# Patient Record
Sex: Male | Born: 1945
Health system: Southern US, Community
[De-identification: ages and names within clinical notes are randomized; demographics above are authoritative.]

## PROBLEM LIST (undated history)

## (undated) DIAGNOSIS — E785 Hyperlipidemia, unspecified: Secondary | ICD-10-CM

## (undated) DIAGNOSIS — C61 Malignant neoplasm of prostate: Secondary | ICD-10-CM

## (undated) DIAGNOSIS — I10 Essential (primary) hypertension: Secondary | ICD-10-CM

## (undated) DIAGNOSIS — G4733 Obstructive sleep apnea (adult) (pediatric): Secondary | ICD-10-CM

## (undated) DIAGNOSIS — R011 Cardiac murmur, unspecified: Secondary | ICD-10-CM

## (undated) DIAGNOSIS — E119 Type 2 diabetes mellitus without complications: Secondary | ICD-10-CM

## (undated) DIAGNOSIS — Z9989 Dependence on other enabling machines and devices: Secondary | ICD-10-CM

## (undated) DIAGNOSIS — R9439 Abnormal result of other cardiovascular function study: Secondary | ICD-10-CM

## (undated) DIAGNOSIS — I2119 ST elevation (STEMI) myocardial infarction involving other coronary artery of inferior wall: Secondary | ICD-10-CM

## (undated) DIAGNOSIS — I2510 Atherosclerotic heart disease of native coronary artery without angina pectoris: Secondary | ICD-10-CM

## (undated) HISTORY — DX: Abnormal result of other cardiovascular function study: R94.39

## (undated) HISTORY — DX: Malignant neoplasm of prostate: C61

## (undated) HISTORY — DX: Hyperlipidemia, unspecified: E78.5

## (undated) HISTORY — DX: Obstructive sleep apnea (adult) (pediatric): G47.33

## (undated) HISTORY — DX: Cardiac murmur, unspecified: R01.1

## (undated) HISTORY — DX: Atherosclerotic heart disease of native coronary artery without angina pectoris: I25.10

## (undated) HISTORY — DX: Dependence on other enabling machines and devices: Z99.89

## (undated) HISTORY — DX: ST elevation (STEMI) myocardial infarction involving other coronary artery of inferior wall: I21.19

## (undated) HISTORY — DX: Type 2 diabetes mellitus without complications: E11.9

## (undated) HISTORY — DX: Essential (primary) hypertension: I10

---

## 1983-08-27 DIAGNOSIS — I2119 ST elevation (STEMI) myocardial infarction involving other coronary artery of inferior wall: Secondary | ICD-10-CM

## 1983-08-27 HISTORY — DX: ST elevation (STEMI) myocardial infarction involving other coronary artery of inferior wall: I21.19

## 1988-08-26 HISTORY — PX: PTCA: SHX146

## 1989-08-26 HISTORY — PX: PERCUTANEOUS CORONARY ROTOBLATOR INTERVENTION (PCI-R): SHX6015

## 1993-08-26 HISTORY — PX: CORONARY ANGIOPLASTY WITH STENT PLACEMENT: SHX49

## 1999-02-20 ENCOUNTER — Ambulatory Visit (HOSPITAL_BASED_OUTPATIENT_CLINIC_OR_DEPARTMENT_OTHER): Admission: RE | Admit: 1999-02-20 | Discharge: 1999-02-20 | Payer: Self-pay | Admitting: Orthopedic Surgery

## 1999-02-22 ENCOUNTER — Encounter: Payer: Self-pay | Admitting: Orthopedic Surgery

## 1999-02-22 ENCOUNTER — Ambulatory Visit (HOSPITAL_COMMUNITY): Admission: RE | Admit: 1999-02-22 | Discharge: 1999-02-23 | Payer: Self-pay | Admitting: Orthopedic Surgery

## 2001-12-24 ENCOUNTER — Inpatient Hospital Stay (HOSPITAL_COMMUNITY): Admission: EM | Admit: 2001-12-24 | Discharge: 2001-12-26 | Payer: Self-pay | Admitting: Cardiovascular Disease

## 2001-12-25 HISTORY — PX: CORONARY ANGIOPLASTY WITH STENT PLACEMENT: SHX49

## 2003-02-11 HISTORY — PX: CARDIAC CATHETERIZATION: SHX172

## 2003-11-07 ENCOUNTER — Inpatient Hospital Stay (HOSPITAL_COMMUNITY): Admission: EM | Admit: 2003-11-07 | Discharge: 2003-11-10 | Payer: Self-pay

## 2003-11-07 HISTORY — PX: CORONARY ANGIOPLASTY WITH STENT PLACEMENT: SHX49

## 2003-12-05 ENCOUNTER — Ambulatory Visit (HOSPITAL_COMMUNITY): Admission: RE | Admit: 2003-12-05 | Discharge: 2003-12-06 | Payer: Self-pay | Admitting: Cardiovascular Disease

## 2003-12-05 HISTORY — PX: CORONARY ANGIOPLASTY WITH STENT PLACEMENT: SHX49

## 2004-10-28 ENCOUNTER — Inpatient Hospital Stay (HOSPITAL_COMMUNITY): Admission: EM | Admit: 2004-10-28 | Discharge: 2004-10-30 | Payer: Self-pay | Admitting: Emergency Medicine

## 2004-10-29 HISTORY — PX: CARDIAC CATHETERIZATION: SHX172

## 2005-10-07 IMAGING — CR DG CHEST 1V PORT
1 series · 1 of 1 positions shown · non-contrast
Comparison: Chest x-ray of 11/08/03.

CLINICAL DATA: Precardiac catheterization chest x-ray. 
 PORTABLE CHEST - [DATE] AT 7707 HOURS:

[view not recorded]
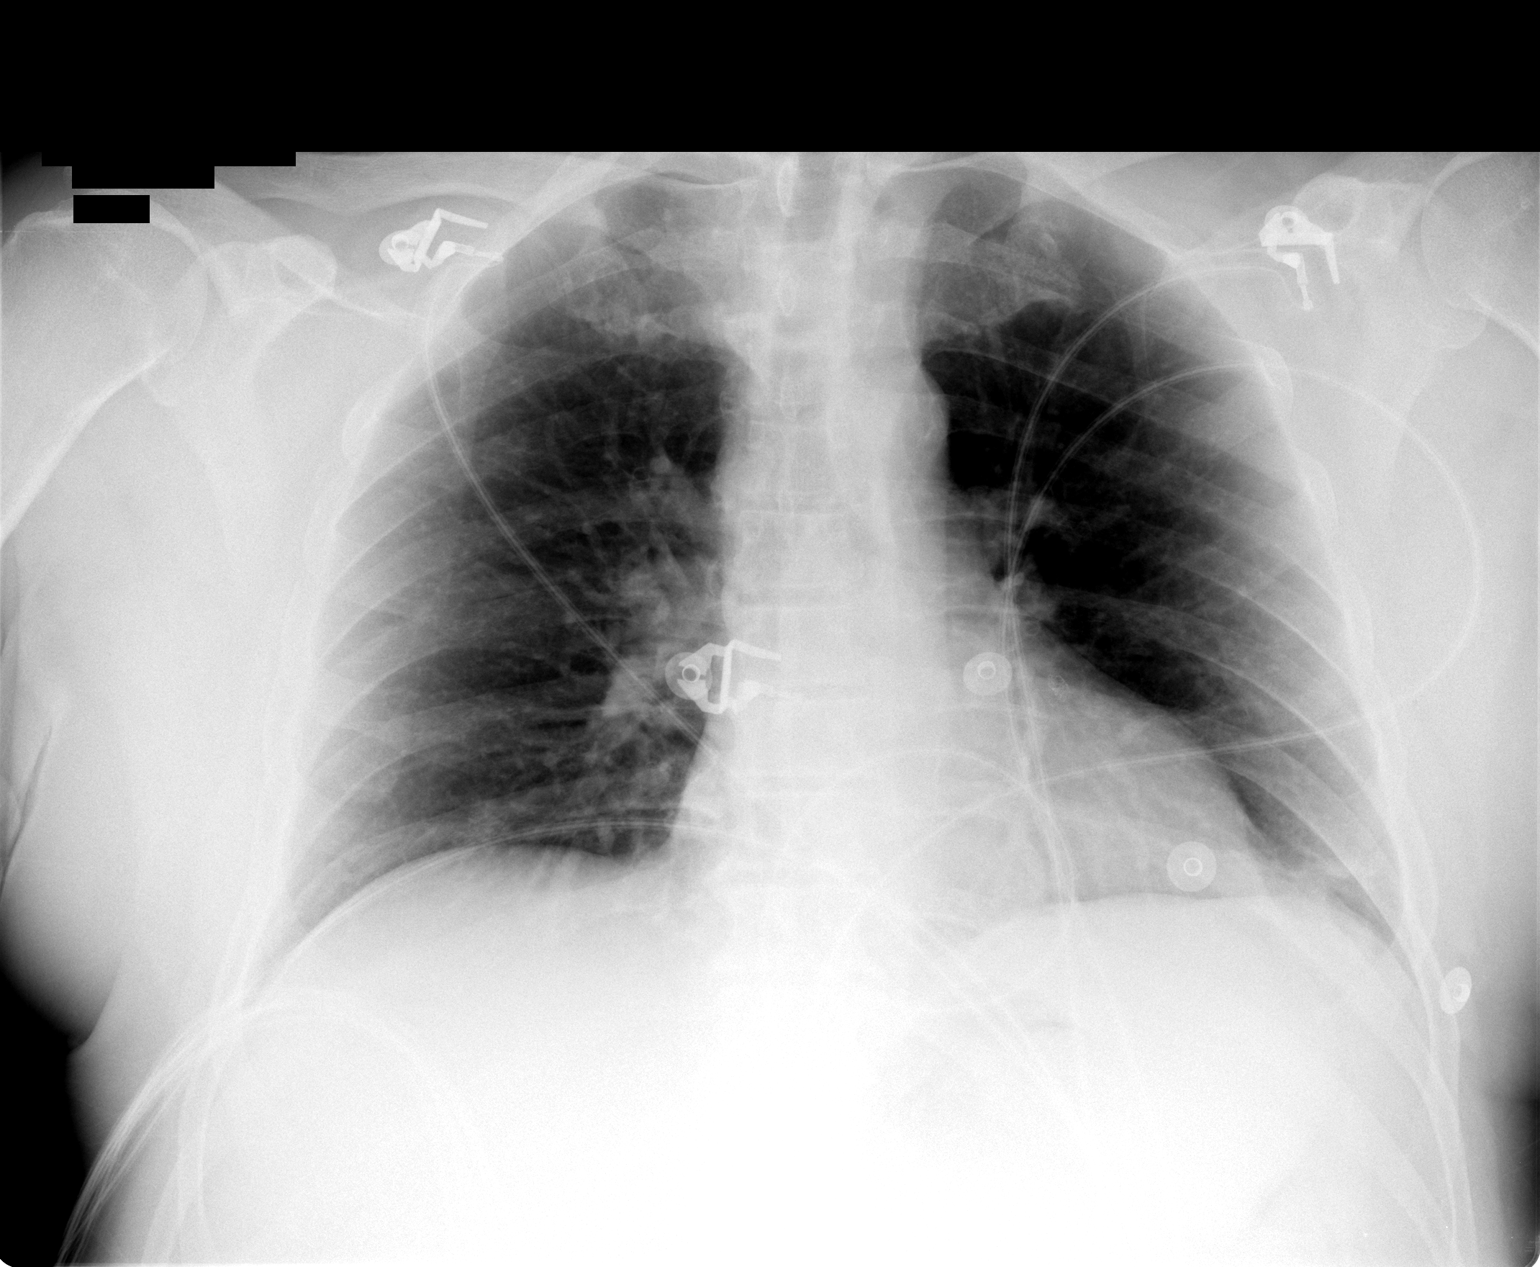

[1 of 1 positions shown; findings below may reference images not displayed]

Mild left basilar linear atelectasis is present.  Opacity medially at the left lung base may represent hiatal hernia.  The heart is within normal limits in size.
IMPRESSION: Mild left basilar linear atelectasis.  Question small hiatal hernia.

## 2012-03-03 DIAGNOSIS — R9439 Abnormal result of other cardiovascular function study: Secondary | ICD-10-CM

## 2012-03-03 HISTORY — DX: Abnormal result of other cardiovascular function study: R94.39

## 2013-01-05 ENCOUNTER — Telehealth: Payer: Self-pay | Admitting: *Deleted

## 2013-01-05 NOTE — Telephone Encounter (Signed)
Returned call from pt on 5.12.14 (also returned call at 5:38pm on 5.12.14).  No answer/No voicemail.  Will await return call from pt.    Pt with concerns r/t sleep apnea and has been scheduled to see Dr. Tresa Endo on 5.29.14 at 8:30am.

## 2013-01-08 ENCOUNTER — Telehealth: Payer: Self-pay | Admitting: Cardiovascular Disease

## 2013-01-08 NOTE — Telephone Encounter (Signed)
Received a letter to call about lab results!

## 2013-01-08 NOTE — Telephone Encounter (Signed)
Message forwarded to W. Waddell, CMA.  

## 2013-01-08 NOTE — Telephone Encounter (Signed)
Patient received a letter from Brandon Herrera letting him know that he has been scheduled to see Dr. Tresa Endo on May 29th @ 8:30 for his sleep apnea evaluation.

## 2013-01-17 ENCOUNTER — Encounter: Payer: Self-pay | Admitting: *Deleted

## 2013-01-20 ENCOUNTER — Encounter: Payer: Self-pay | Admitting: Cardiovascular Disease

## 2013-01-21 ENCOUNTER — Ambulatory Visit (INDEPENDENT_AMBULATORY_CARE_PROVIDER_SITE_OTHER): Payer: Medicare Other | Admitting: Cardiovascular Disease

## 2013-01-21 ENCOUNTER — Encounter: Payer: Self-pay | Admitting: Cardiovascular Disease

## 2013-01-21 VITALS — BP 120/60 | HR 84 | Ht 69.0 in | Wt 205.0 lb

## 2013-01-21 DIAGNOSIS — G4730 Sleep apnea, unspecified: Secondary | ICD-10-CM | POA: Insufficient documentation

## 2013-01-21 DIAGNOSIS — E119 Type 2 diabetes mellitus without complications: Secondary | ICD-10-CM

## 2013-01-21 DIAGNOSIS — I119 Hypertensive heart disease without heart failure: Secondary | ICD-10-CM

## 2013-01-21 DIAGNOSIS — E785 Hyperlipidemia, unspecified: Secondary | ICD-10-CM

## 2013-01-21 DIAGNOSIS — I2510 Atherosclerotic heart disease of native coronary artery without angina pectoris: Secondary | ICD-10-CM

## 2013-01-21 NOTE — Patient Instructions (Signed)
Your physician recommends that you schedule a follow-up appointment in: 1 YEAR.  No changes have been made in your cardiac care today.

## 2013-01-21 NOTE — Progress Notes (Signed)
Patient ID: Brandon Herrera, male   DOB: 25-Apr-1946, 67 y.o.   MRN: [id number] HPI: Brandon Herrera, is a 67 y.o. male who presents to the office today for sleep clinic evaluation and cardiology followup evaluation. Brandon Herrera is a 67 year old male who has known coronary artery disease. He suffered in an inferior myocardial infarction in 1985 and was found to have total RCA occlusion. In 1990 he underwent PTCA of the circumflex and in 1991 directional coronary atherectomy of an eccentric ossified LAD stenosis. He is also status post interventions to circumflex coronary artery with his last intervention in 2005 at which time he also had a intervention to the LAD and first diagonal vessel. He has a history of type 2 diabetes mellitus, hypertension, and hyperlipidemia. He now sees Dr. Jefm Miles in [place]. He also has remote history of prostate CA and is status post prostate seed implantation  In 2011 he ultimately consented to undergo a sleep study to my concerns for significant sleep apnea on that study, he had mild sleep apnea overall with an AHI of 7.44/hr but sleep apnea was moderate at 16.4/hr.He did drop his oxygen saturation to 88%. He has been using CPAP therapy since 2011 and notes huge difference is in his sense of well-being. He presents now for a one-year followup evaluation.  A download was obtained from last March 2013 through June 2013 which did show 100% usage and averaging 7 hours and 29 minutes per night. At that time, his AHI was 3.5 per hour. I did interrogate his S9 Elite ResMed unit today over the past month he has been averaging 8.2 hours of usage and is unit is set on a 10 cm fixed pressure. He has minimal leak at 9.6. His AHI remains excellent at 3.1. His total apnea index was 0.8.  Epworth Sleepiness Scale: Situation   Chance of Dozing/Sleeping (0 = never , 1 = slight chance , 2 = moderate chance , 3 = high chance )   sitting and reading 0   watching TV 0   sitting inactive in a  public place 0   being a passenger in a motor vehicle for an hour or more 0   lying down in the afternoon 2   sitting and talking to someone 0   sitting quietly after lunch (no alcohol) 0   0  Total score 2    Past Medical History  Diagnosis Date  . CAD (coronary artery disease)   . Inferior MI 1985    totalled RCA  . Hypertension   . Diabetes mellitus   . Hyperlipidemia   . OSA on CPAP   . Prostate cancer     Seed implant  . Heart murmur     07/28/2008 ECHO: mild mitral annular ca+,AOV mildly sclerotic,mild LVH,mod.global hypokinesis,mild to mod post wall hypokinesis, EF 35-40%,LA mildly dilated  . Abnormal nuclear stress test 03/03/2012    mod size inferior scar w/new anterolateral wall ischemia towards apex    Past Surgical History  Procedure Laterality Date  . Ptca  1990    CX  . Percutaneous coronary rotoblator intervention (pci-r)  1991    LAD  . Coronary angioplasty with stent placement  1995    CX & LAD  . Coronary angioplasty with stent placement  12/25/2001    LCX  . Cardiac catheterization  02/11/2003    patent CX stent,chronically occluded RCA  . Coronary angioplasty with stent placement  11/07/2003    CX, planned stenting of  LAD later  . Coronary angioplasty with stent placement  12/05/2003    LAD  . Cardiac catheterization  10/29/2004    No evidence of restenosis LAD but 30-40% narrowing prox. to stent,widely patient CX, old subtotalled RCA    Allergies  Allergen Reactions  . Zetia (Ezetimibe) Other (See Comments)    myalgias    Current Outpatient Prescriptions  Medication Sig Dispense Refill  . amLODipine (NORVASC) 5 MG tablet Take 5 mg by mouth daily.      Marland Kitchen aspirin 81 MG tablet Take 81 mg by mouth daily.      Marland Kitchen atorvastatin (LIPITOR) 40 MG tablet Take 40 mg by mouth daily.      . benazepril (LOTENSIN) 20 MG tablet Take 20 mg by mouth daily.      . carvedilol (COREG) 25 MG tablet Take 25 mg by mouth 2 (two) times daily with a meal.      . clopidogrel  (PLAVIX) 75 MG tablet Take 75 mg by mouth daily.      . diphenhydrAMINE (BENADRYL) 25 MG tablet Take 25 mg by mouth every 6 (six) hours as needed for itching.      . fish oil-omega-3 fatty acids 1000 MG capsule Take 1 g by mouth daily. 1-2 daily      . insulin glargine (LANTUS) 100 UNIT/ML injection Inject 35 Units into the skin 2 (two) times daily.      . isosorbide mononitrate (IMDUR) 30 MG 24 hr tablet Take 30 mg by mouth daily.      . metFORMIN (GLUCOPHAGE) 500 MG tablet Take 500 mg by mouth 2 (two) times daily with a meal.      . Multiple Vitamin (MULTIVITAMIN) tablet Take 1 tablet by mouth daily.      Marland Kitchen triamterene-hydrochlorothiazide (MAXZIDE-25) 37.5-25 MG per tablet Take 0.5 tablets by mouth daily.      . vitamin B-12 (CYANOCOBALAMIN) 1000 MCG tablet Take 1,000 mcg by mouth daily.      . vitamin E 400 UNIT capsule Take 400 Units by mouth daily. Takes occasionally       No current facility-administered medications for this visit.    SOCHX is notable that he is divorced. He does not have children he did have family issues in the past particularly with his brother. This seems to be improving. There is no tobacco or alcohol use.  ROS is negative for fever chills night sweats. He feels that he is sleeping well with his current unit. He believes he has good sleep efficiency. He does not awaken frequently. Sleep is restorative. He is unaware of breakthrough snoring. He denies restless legs. He denies bruxism. He is unaware of tachycardia palpitations, presyncope or syncope. He denies any recent chest pain. He denies any significant recent swelling. Other system review is negative.  PE BP 120/60  Pulse 84  Ht 5\' 9"  (1.753 m)  Wt 205 lb (92.987 kg)  BMI 30.26 kg/m2  General: Alert, oriented, no distress.  HEENT: Normocephalic, atraumatic. Pupils round and reactive; sclera anicteric; Fundi mild arteriolar narrowing. Nose without nasal septal hypertrophy Mouth/Parynx benign; Mallinpatti  scale 3 Neck: No JVD, no carotid briuts Lungs: clear to ausculatation and percussion; no wheezing or rales Heart: RRR, s1 s2 normal 1/6 systolic murmur Abdomen: soft, nontender; no hepatosplenomehaly, BS+; abdominal aorta nontender and not dilated by palpation. Pulses 2+ Extremities: no clubbinbg cyanosis or edema, Homan's sign negative  Neurologic: grossly nonfocal    LABS:  BMET No results found for this basename: na,  k, cl, co2, glucose, bun, creatinine, calcium, gfrnonaa, gfraa     Hepatic Function Panel  No results found for this basename: prot, albumin, ast, alt, alkphos, bilitot, bilidir, ibili     CBC No results found for this basename: wbc, rbc, hgb, hct, plt, mcv, mch, mchc, rdw, neutrabs, lymphsabs, monoabs, eosabs, basosabs     BNP No results found for this basename: probnp    Lipid Panel  No results found for this basename: chol, trig, hdl, cholhdl, vldl, ldlcalc     RADIOLOGY: No results found.    ASSESSMENT AND PLAN: From a sleep perspective, Mr. Wilmes is doing well. His CPAP unit is an S9 Elite, and he uses a full face mask Quatro medium size. I did look at his machine today in the office. He is in need for new filter and essentially has only changed his filter once in the last 3 years. We also provided him to get new cushions for his face mask. His sleep pattern is stable. His AHI is excellent his Epworth sleepiness score argues against residual daytime sleepiness. From a cardiac perspective he is chest pain-free without arrhythmias. BP remain stable. He tells me his primary care physician recently checked laboratory in [place] and I will try to obtain the results of this blood work. Target LDL is less than 70 in this patient with established coronary artery disease. I will see him in one year for followup evaluation.    Lennette Bihari, MD, [facility]  01/21/2013 10:27 AM

## 2013-02-08 ENCOUNTER — Other Ambulatory Visit: Payer: Self-pay | Admitting: *Deleted

## 2013-02-08 MED ORDER — BENAZEPRIL HCL 20 MG PO TABS: 20.0000 mg | ORAL_TABLET | Freq: Every day | ORAL | Status: DC

## 2013-09-02 DIAGNOSIS — C61 Malignant neoplasm of prostate: Secondary | ICD-10-CM | POA: Insufficient documentation

## 2013-09-20 ENCOUNTER — Ambulatory Visit (INDEPENDENT_AMBULATORY_CARE_PROVIDER_SITE_OTHER): Payer: Medicare HMO | Admitting: Cardiovascular Disease

## 2013-09-20 ENCOUNTER — Encounter: Payer: Self-pay | Admitting: Cardiovascular Disease

## 2013-09-20 VITALS — BP 114/76 | HR 75 | Ht 70.0 in | Wt 202.7 lb

## 2013-09-20 DIAGNOSIS — G4730 Sleep apnea, unspecified: Secondary | ICD-10-CM

## 2013-09-20 DIAGNOSIS — E119 Type 2 diabetes mellitus without complications: Secondary | ICD-10-CM

## 2013-09-20 DIAGNOSIS — E785 Hyperlipidemia, unspecified: Secondary | ICD-10-CM

## 2013-09-20 DIAGNOSIS — I119 Hypertensive heart disease without heart failure: Secondary | ICD-10-CM

## 2013-09-20 DIAGNOSIS — I2510 Atherosclerotic heart disease of native coronary artery without angina pectoris: Secondary | ICD-10-CM

## 2013-09-20 NOTE — Patient Instructions (Signed)
Your physician wants you to follow-up in: 1 year. You will receive a reminder letter in the mail two months in advance. If you don't receive a letter, please call our office to schedule the follow-up appointment.  

## 2013-09-21 ENCOUNTER — Encounter: Payer: Self-pay | Admitting: Cardiovascular Disease

## 2013-09-21 NOTE — Progress Notes (Signed)
Patient ID: [name], male   DOB: July 27, 1946, 69 y.o.   MRN: [id number]    HPI: Brandon Herrera, is a 68 y.o. male who presents to the office today for cardiology followup evaluation.  Brandon Herrera is a 68 year old male who has known coronary artery disease. He suffered in an inferior myocardial infarction in 1985 and was found to have total RCA occlusion. In 1990 he underwent PTCA of the circumflex and in 1991 directional coronary atherectomy of an eccentric ossified LAD stenosis. He is also status post interventions to circumflex coronary artery with his last intervention in 2005 at which time he also had a intervention to the LAD and first diagonal vessel. He has a history of type 2 diabetes mellitus, hypertension, and hyperlipidemia.  He also has remote history of prostate CA and is status post prostate seed implantation  In 2011 he ultimately consented to undergo a sleep study to my concerns for significant sleep apnea on that study, he had mild sleep apnea overall with an AHI of 7.44/hr but sleep apnea was moderate at 16.4/hr.He did drop his oxygen saturation to 88%. He has been using CPAP therapy since 2011 and notes huge difference is in his sense of well-being.  A download from March 2013 through June 2013 showed 100% usage, averaging 7 hours and 29 minutes per night. At that time, his AHI was 3.5 per hour. He presently denies any breakthrough snoring. He denies residual daytime sleepiness. He denies restless legs.  Over the past 6 months, he has remained fairly stable from a cardiac standpoint. Specifically he denies recurrent anginal symptoms. He believes his blood pressure has been controlled.  Recently, he saw Dr. Arlyss Repress for URI symptoms and was treated for bronchitis. He also has experienced continued cough.    Past Medical History  Diagnosis Date  . CAD (coronary artery disease)   . Inferior MI 1985    totalled RCA  . Hypertension   . Diabetes mellitus   . Hyperlipidemia   .  OSA on CPAP   . Prostate cancer     Seed implant  . Heart murmur     07/28/2008 ECHO: mild mitral annular ca+,AOV mildly sclerotic,mild LVH,mod.global hypokinesis,mild to mod post wall hypokinesis, EF 35-40%,LA mildly dilated  . Abnormal nuclear stress test 03/03/2012    mod size inferior scar w/new anterolateral wall ischemia towards apex    Past Surgical History  Procedure Laterality Date  . Ptca  1990    CX  . Percutaneous coronary rotoblator intervention (pci-r)  1991    LAD  . Coronary angioplasty with stent placement  1995    CX & LAD  . Coronary angioplasty with stent placement  12/25/2001    LCX  . Cardiac catheterization  02/11/2003    patent CX stent,chronically occluded RCA  . Coronary angioplasty with stent placement  11/07/2003    CX, planned stenting of LAD later  . Coronary angioplasty with stent placement  12/05/2003    LAD  . Cardiac catheterization  10/29/2004    No evidence of restenosis LAD but 30-40% narrowing prox. to stent,widely patient CX, old subtotalled RCA    Allergies  Allergen Reactions  . Zetia [Ezetimibe] Other (See Comments)    myalgias    Current Outpatient Prescriptions  Medication Sig Dispense Refill  . amLODipine (NORVASC) 5 MG tablet Take 5 mg by mouth daily.      Marland Kitchen aspirin 81 MG tablet Take 81 mg by mouth daily.      Marland Kitchen  atorvastatin (LIPITOR) 40 MG tablet Take 40 mg by mouth daily.      . benazepril (LOTENSIN) 20 MG tablet Take 1 tablet (20 mg total) by mouth daily.  30 tablet  11  . carvedilol (COREG) 25 MG tablet Take 25 mg by mouth 2 (two) times daily with a meal.      . cephALEXin (KEFLEX) 500 MG capsule Take 1 capsule by mouth 3 (three) times daily.      . clopidogrel (PLAVIX) 75 MG tablet Take 75 mg by mouth daily.      . fish oil-omega-3 fatty acids 1000 MG capsule Take 1 g by mouth daily. 1-2 daily      . insulin glargine (LANTUS) 100 UNIT/ML injection Inject 35 Units into the skin 2 (two) times daily.      . isosorbide mononitrate  (IMDUR) 30 MG 24 hr tablet Take 30 mg by mouth daily.      . metFORMIN (GLUCOPHAGE) 500 MG tablet Take 500 mg by mouth 2 (two) times daily with a meal.      . Multiple Vitamin (MULTIVITAMIN) tablet Take 1 tablet by mouth daily.      . predniSONE (DELTASONE) 10 MG tablet Take 1 tablet by mouth as directed.      . triamterene-hydrochlorothiazide (MAXZIDE-25) 37.5-25 MG per tablet Take 0.5 tablets by mouth daily.      . vitamin B-12 (CYANOCOBALAMIN) 1000 MCG tablet Take 1,000 mcg by mouth daily.      . vitamin E 400 UNIT capsule Take 400 Units by mouth daily. Takes occasionally       No current facility-administered medications for this visit.    SOCHX is notable that he is divorced. He does not have children he did have family issues in the past particularly with his brother. This seems to be improving. There is no tobacco or alcohol use.  ROS is negative for fever chills night sweats. He denies visual changes or hearing changes. He is unaware of lymphadenopathy . He has had a cough and URI symptoms. He denies PND orthopnea. He is unaware of wheezing. He denies anginal symptoms. He denies presyncope or syncope. He denies abdominal pain the there is no blood in stool or urine. He denies change in bowel or bladder habits. There is no claudication. He denies significant edema. He is diabetic. There is no issues with thyroid abnormalities. He feels that he is sleeping well with his current unit. He believes he has good sleep efficiency. He does not awaken frequently. Sleep is restorative. He is unaware of breakthrough snoring. He denies restless legs. He denies bruxism. Other comprehensive 14 point system review is negative.  PE BP 114/76  Pulse 75  Ht 5\' 10"  (1.778 m)  Wt 202 lb 11.2 oz (91.944 kg)  BMI 29.08 kg/m2  General: Alert, oriented, no distress.  HEENT: Normocephalic, atraumatic. Pupils round and reactive; sclera anicteric; Fundi mild arteriolar narrowing. No xanthelasmas Nose without  nasal septal hypertrophy Mouth/Parynx benign; Mallinpatti scale 3 Neck: No JVD, no carotid bruits with normal carotid upstroke Chest wall: Nontender to palpation Lungs: clear to ausculatation and percussion; no wheezing or rales Heart: RRR, s1 s2 normal 1/6 systolic murmur, no S3 or S4 gallop Abdomen: soft, nontender; no hepatosplenomehaly, BS+; abdominal aorta nontender and not dilated by palpation. Back: No CVA tenderness Pulses 2+ Extremities: no clubbinbg cyanosis or edema, Homan's sign negative  Neurologic: grossly nonfocal, cranial nerves grossly normal. Psychological: Normal affect and mood   ECG (independently interpreted by me): Normal  sinus rhythm. Nonspecific T changes. Normal intervals.    LABS:  BMET No results found for this basename: na,  k,  cl,  co2,  glucose,  bun,  creatinine,  calcium,  gfrnonaa,  gfraa     Hepatic Function Panel  No results found for this basename: prot,  albumin,  ast,  alt,  alkphos,  bilitot,  bilidir,  ibili     CBC No results found for this basename: wbc,  rbc,  hgb,  hct,  plt,  mcv,  mch,  mchc,  rdw,  neutrabs,  lymphsabs,  monoabs,  eosabs,  basosabs     BNP No results found for this basename: probnp    Lipid Panel  No results found for this basename: chol,  trig,  hdl,  cholhdl,  vldl,  ldlcalc     RADIOLOGY: No results found.    ASSESSMENT AND PLAN: Brandon Herrera has established coronary artery disease documented over 30 years when he presented with his initially or wall myocardial infarction in 1985 was found to have total RCA occlusion. He also is status post remote PTCA of the circumflex in 1990, and in 1991 underwent directional coronary directly of the eccentrically calcified LAD stenosis. His last intervention in 2005 was done to his LAD and first diagonal vessel which was stented. He has continued to be stable without recurrent anginal symptomatology. His blood pressure is currently well-controlled on his medical  regimen consisting of amlodipine 5 mg, Lotensin 20 mg, carvedilol 25 mg twice a day. He also is on isosorbide mononitrate therapy as well as Maxide. He is on lipid-lowering therapy with atorvastatin 40 mg. He tells me his primary primary physician had recently checked labs. I did obtain a copy of these lab toward results which were done in [place]. His hemoglobin A1c was mildly elevated at 7.3. Renal function liver function studies were normal. The studies were excellent with a total cholesterol of 139 triglycerides 153 HDL 48 LDL 60 on his current therapy. He will continue current treatment as prescribed. He is continuing to use his CPAP with 100% compliance and his Epworth Sleepiness Scale score is normal arguing against hypersomnolence. I will see him in one year for followup evaluation.    Troy Sine, MD, [facility]  09/21/2013 2:12 PM

## 2013-10-25 DIAGNOSIS — F329 Major depressive disorder, single episode, unspecified: Secondary | ICD-10-CM | POA: Insufficient documentation

## 2013-11-08 DIAGNOSIS — G4700 Insomnia, unspecified: Secondary | ICD-10-CM | POA: Insufficient documentation

## 2013-11-11 ENCOUNTER — Telehealth: Payer: Self-pay | Admitting: *Deleted

## 2013-11-11 NOTE — Telephone Encounter (Signed)
Returned call.  Pt informed no update as of yet.  Will call once response given.  Informed Dr. Claiborne Billings is still in clinic seeing patients and RN may not be able to call him back w/ response until tomorrow morning.  Pt verbalized understanding and agreed w/ plan.

## 2013-11-11 NOTE — Telephone Encounter (Signed)
Returned call.  Line busy x 2.  Will try again later.

## 2013-11-11 NOTE — Telephone Encounter (Signed)
Spoke w/ Dr. Claiborne Billings and advised pt call [facility] to do a download.    Call to pt and informed.  Pt verbalized understanding and agreed w/ plan.  Pt given number to [place] [phone] and agreed to contact them to set up download.

## 2013-11-11 NOTE — Telephone Encounter (Signed)
Returned call and pt verified x 2.  Pt stated she takes about 8 different medications.  Stated he take 4 in the morning and wants to know if one of them is making him feel "zapped."   Pt complaints:  Fatigue  Not staying asleep, even w/ Ambien  Taking small naps after waking up ~ 2 - 3 am every morning  No low BPs: stated normal after taking BP meds  ? CPAP, but thinks it is working fine. Changed the cushion the other day  Pharmacist suggested he may need Ambien extended-release (pt concerned r/t how tired he is now)  Pt informed Dr. Claiborne Billings will be notified for further instructions and he will be notified once a response is given.  Also advised to contact [facility] to check CPAP machine to make sure it's working properly as his symptoms sound like they are r/t insomnia.  Pt verbalized understanding and agreed w/ plan.  Message forwarded to Dr. Arnette Norris, CMA.  This note printed and placed on cart.

## 2013-11-11 NOTE — Telephone Encounter (Signed)
Pt called to see if you had found any information for him about his medicine.

## 2013-11-11 NOTE — Telephone Encounter (Signed)
Pt stated that he is very sleepy during the day. He wants to know if it is a side effect of one of his medications. He takes multiple medications and he has some questions.  TK

## 2013-11-18 ENCOUNTER — Telehealth: Payer: Self-pay | Admitting: Cardiovascular Disease

## 2013-11-18 NOTE — Telephone Encounter (Signed)
Returned call and pt verified x 2.  Pt stated he called [facility] and they took the card and sent the information to Dr. Claiborne Billings.  Pt wants to know if his machine is off or not.  Pt informed message will be sent to Dr. Arnette Norris, CMA r/t his machine as this RN is unable to see if fax was received.  Pt verbalized understanding and agreed w/ plan.  Pt doesn't have an answering machine and will call back if call missed.  Message forwarded to Dr. Arnette Norris, CMA.

## 2013-11-18 NOTE — Telephone Encounter (Signed)
Pt called and wanted to know if anybody had called him.This was in regards to his sleep machine.

## 2013-11-22 NOTE — Telephone Encounter (Signed)
Informed patient CPAP download received and results are normal.

## 2013-12-09 DIAGNOSIS — G609 Hereditary and idiopathic neuropathy, unspecified: Secondary | ICD-10-CM | POA: Insufficient documentation

## 2014-01-05 ENCOUNTER — Telehealth: Payer: Self-pay | Admitting: Cardiovascular Disease

## 2014-01-05 NOTE — Telephone Encounter (Signed)
Please call,having problems with his legs. Not sure what is causing it,cholesterol medicine or diabetes related.

## 2014-01-05 NOTE — Telephone Encounter (Signed)
RN spoke to patient.  Brandon Herrera states he has been having some leg cramps and tingling.  He states he does not know if it caused by his cholesterol or diabetes. RN informed patient it could be either issue.   RN informed patient to contact primary and if any other issues he can call back.

## 2014-02-23 ENCOUNTER — Telehealth: Payer: Self-pay | Admitting: Cardiovascular Disease

## 2014-02-23 NOTE — Telephone Encounter (Signed)
Please call,he wants you to recommend him an Endocrinologist. He wants one in the [facility] or [place] area please.y

## 2014-02-24 NOTE — Telephone Encounter (Signed)
Patient states that he fell this morning climbing the front steps of his house.  Wants to know if it could be some of his medications.  Also, have we found out about the Endocrinologist in the [facility] [place] area yet?

## 2014-02-24 NOTE — Telephone Encounter (Signed)
RN- spoke to patient. Patient states he thinks he needs to see someone about his diabetes. He prefers someone in [place] area Patient states he is outside of his primary's office now. RN informed patient that his primary would be best to assist him. Dr Claiborne Billings is not familiar with doctors in that area.

## 2014-02-27 ENCOUNTER — Telehealth: Payer: Self-pay | Admitting: Physician Assistant

## 2014-02-27 NOTE — Telephone Encounter (Signed)
Brandon Herrera is a patient of Dr. Claiborne Billings Patient called because he was concerned about the problems he is having with his leg and wanted to know if the atorvastatin he was on could be causing him.  Brandon Herrera is having a problem with his leg buckling under him. He has fallen at least once. He feels that the leg is weak. He is very concerned about this. It has gotten worse over the last couple of days. He is not having generalized joint aches or weakness. Because of the weakness in that leg, he fell about a week ago and is concerned that he did some damage at that time. He has not been evaluated since the fall.  Advised Brandon Herrera that the best thing he could do would be to go to the closest emergency room or urgent care if MB tears needed. Otherwise, he should contact his primary care physician in the morning and try to be seen. He may need referral to a specialist but without any examination or screening, it is unclear what kind of interventions he will need. Advised him that statins have caused musculoskeletal and joint pain in some patients, but they are not known for causing unilateral leg weakness. Advised him that he should be evaluated and then discuss with that physician if cardiology should be involved at this point. Encouraged him to followup with Dr. Claiborne Billings as scheduled.

## 2014-03-09 ENCOUNTER — Telehealth: Payer: Self-pay | Admitting: Cardiovascular Disease

## 2014-03-09 NOTE — Telephone Encounter (Signed)
Pt wanted you to know he has a new primary doctor.His new doctor Dr Saunders Glance Kalish-[phone].His  Diabetes doctor is Dr Peri Jefferson.

## 2014-03-09 NOTE — Telephone Encounter (Signed)
Forwarded to [employer]

## 2014-03-15 ENCOUNTER — Encounter: Payer: Self-pay | Admitting: Cardiovascular Disease

## 2014-03-27 ENCOUNTER — Telehealth: Payer: Self-pay | Admitting: Physician Assistant

## 2014-03-27 NOTE — Telephone Encounter (Signed)
    I returned an outpatient phone call to patient wanting to know if he could take NSAID for back pain and sciatica. Patient is on DAPT with ASA/Plavix and i recommended that he not take any more blood thinners. I advised him to follow up with his PCP about other pain medications options to help control his pain. He will take Tylenol for now.    Perry Mount PA-C  [facility]

## 2014-03-28 ENCOUNTER — Other Ambulatory Visit: Payer: Self-pay | Admitting: *Deleted

## 2014-03-28 MED ORDER — TRIAMTERENE-HCTZ 37.5-25 MG PO TABS: 0.5000 | ORAL_TABLET | Freq: Every day | ORAL | Status: DC

## 2014-03-31 ENCOUNTER — Telehealth: Payer: Self-pay | Admitting: Cardiovascular Disease

## 2014-03-31 ENCOUNTER — Other Ambulatory Visit: Payer: Self-pay | Admitting: *Deleted

## 2014-03-31 MED ORDER — TRIAMTERENE-HCTZ 37.5-25 MG PO TABS: 0.5000 | ORAL_TABLET | Freq: Every day | ORAL | Status: DC

## 2014-03-31 NOTE — Telephone Encounter (Signed)
Rx was sent to pharmacy electronically. 

## 2014-03-31 NOTE — Telephone Encounter (Signed)
Pt Triamterene was sent to to the wrong pharmacy. Please send to [facility] on [address]. Please call today,he is out of it.

## 2014-06-17 ENCOUNTER — Telehealth: Payer: Self-pay | Admitting: Cardiovascular Disease

## 2014-06-17 NOTE — Telephone Encounter (Signed)
Pt. Called , no answer LMTCB

## 2014-06-17 NOTE — Telephone Encounter (Signed)
Pt called in stating that he had a fall a few months ago and prescribed Celebrex. He stated that he has some swelling in his legs at night but it goes away during the day. He wanted to know if it ok to continue to take this med. Please call  Thanks

## 2014-06-24 ENCOUNTER — Telehealth: Payer: Self-pay | Admitting: Cardiovascular Disease

## 2014-06-24 NOTE — Telephone Encounter (Addendum)
Pt called concerned because he does not recall having a sleep study done with Dr. Claiborne Billings on 12/2012. He would like to speak to a nurse about this. I confirmed his address and telephone number with him , which checked out. Please call  thanks

## 2014-06-24 NOTE — Telephone Encounter (Signed)
Spoke with pt, questions regarding appointment answered.

## 2014-09-21 ENCOUNTER — Ambulatory Visit (INDEPENDENT_AMBULATORY_CARE_PROVIDER_SITE_OTHER): Payer: PPO | Admitting: Cardiovascular Disease

## 2014-09-21 ENCOUNTER — Ambulatory Visit: Payer: Medicare HMO | Admitting: Cardiovascular Disease

## 2014-09-21 VITALS — BP 122/70 | HR 66 | Ht 70.0 in | Wt 213.2 lb

## 2014-09-21 DIAGNOSIS — I119 Hypertensive heart disease without heart failure: Secondary | ICD-10-CM

## 2014-09-21 DIAGNOSIS — E1159 Type 2 diabetes mellitus with other circulatory complications: Secondary | ICD-10-CM

## 2014-09-21 DIAGNOSIS — Z9989 Dependence on other enabling machines and devices: Secondary | ICD-10-CM

## 2014-09-21 DIAGNOSIS — E785 Hyperlipidemia, unspecified: Secondary | ICD-10-CM

## 2014-09-21 DIAGNOSIS — I2510 Atherosclerotic heart disease of native coronary artery without angina pectoris: Secondary | ICD-10-CM

## 2014-09-21 DIAGNOSIS — G4733 Obstructive sleep apnea (adult) (pediatric): Secondary | ICD-10-CM

## 2014-09-21 DIAGNOSIS — I10 Essential (primary) hypertension: Secondary | ICD-10-CM

## 2014-09-21 NOTE — Patient Instructions (Signed)
Your physician wants you to follow-up in: 1 year or sooner if needed with Dr. Kelly. You will receive a reminder letter in the mail two months in advance. If you don't receive a letter, please call our office to schedule the follow-up appointment. 

## 2014-09-22 ENCOUNTER — Encounter: Payer: Self-pay | Admitting: Cardiovascular Disease

## 2014-09-22 DIAGNOSIS — G4733 Obstructive sleep apnea (adult) (pediatric): Secondary | ICD-10-CM | POA: Insufficient documentation

## 2014-09-22 DIAGNOSIS — I10 Essential (primary) hypertension: Secondary | ICD-10-CM | POA: Insufficient documentation

## 2014-09-22 DIAGNOSIS — Z9989 Dependence on other enabling machines and devices: Secondary | ICD-10-CM

## 2014-09-22 NOTE — Progress Notes (Signed)
Patient ID: Brandon Herrera, male   DOB: [birth year], 69 y.o.   MRN: [id number]    HPI: MARQUIN PATINO is a 69 y.o. male who presents to the office today for a one-year cardiology followup evaluation.  Mr. Walsh  has known coronary artery disease. He suffered in an inferior myocardial infarction in 1985 and was found to have total RCA occlusion. In 1990 he underwent PTCA of the circumflex and in 1991 directional coronary atherectomy of an eccentric ossified LAD stenosis. He is also status post interventions to circumflex coronary artery with his last intervention in 2005 at which time he also had a intervention to the LAD and first diagonal vessel. He has a history of type 2 diabetes mellitus, hypertension, and hyperlipidemia.  He also has remote history of prostate CA and is status post prostate seed implantation  In 2011 a sleep study demonstrated mild sleep apnea overall with an AHI of 7.44/hr but sleep apnea was moderate at 16.4/hr with REM sleep. He  dropped his oxygen saturation to 88%. He has been using CPAP therapy since 2011 and notes huge difference is in his sense of well-being.  A download from March 2013 through June 2013 showed 100% usage, averaging 7 hours and 29 minutes per night. At that time, his AHI was 3.5 per hour. He presently denies any breakthrough snoring. He denies residual daytime sleepiness. He denies restless legs.  Over the past year, he has remained fairly stable from a cardiac standpoint. Specifically he denies recurrent anginal symptoms. He believes his blood pressure has been controlled.  He has been active.  He admits to compliance with his medical regimen.    Past Medical History  Diagnosis Date  . CAD (coronary artery disease)   . Inferior MI 1985    totalled RCA  . Hypertension   . Diabetes mellitus   . Hyperlipidemia   . OSA on CPAP   . Prostate cancer     Seed implant  . Heart murmur     07/28/2008 ECHO: mild mitral annular ca+,AOV mildly sclerotic,mild  LVH,mod.global hypokinesis,mild to mod post wall hypokinesis, EF 35-40%,LA mildly dilated  . Abnormal nuclear stress test 03/03/2012    mod size inferior scar w/new anterolateral wall ischemia towards apex    Past Surgical History  Procedure Laterality Date  . Ptca  1990    CX  . Percutaneous coronary rotoblator intervention (pci-r)  1991    LAD  . Coronary angioplasty with stent placement  1995    CX & LAD  . Coronary angioplasty with stent placement  12/25/2001    LCX  . Cardiac catheterization  02/11/2003    patent CX stent,chronically occluded RCA  . Coronary angioplasty with stent placement  11/07/2003    CX, planned stenting of LAD later  . Coronary angioplasty with stent placement  12/05/2003    LAD  . Cardiac catheterization  10/29/2004    No evidence of restenosis LAD but 30-40% narrowing prox. to stent,widely patient CX, old subtotalled RCA    Allergies  Allergen Reactions  . Zetia [Ezetimibe] Other (See Comments)    myalgias    Current Outpatient Prescriptions  Medication Sig Dispense Refill  . amLODipine (NORVASC) 5 MG tablet Take 5 mg by mouth daily.    Marland Kitchen aspirin 81 MG tablet Take 81 mg by mouth daily.    Marland Kitchen atorvastatin (LIPITOR) 40 MG tablet Take 40 mg by mouth daily.    . benazepril (LOTENSIN) 20 MG tablet Take 1 tablet (20  mg total) by mouth daily. 30 tablet 11  . carvedilol (COREG) 25 MG tablet Take 25 mg by mouth 2 (two) times daily with a meal.    . celecoxib (CELEBREX) 200 MG capsule Take 1 capsule by mouth daily.    . clopidogrel (PLAVIX) 75 MG tablet Take 75 mg by mouth daily.    . fish oil-omega-3 fatty acids 1000 MG capsule Take 1 g by mouth daily. 1-2 daily    . gabapentin (NEURONTIN) 100 MG capsule Take 2 capsules by mouth daily.  5  . insulin aspart protamine- aspart (NOVOLOG MIX 70/30) (70-30) 100 UNIT/ML injection Inject 40 Units into the skin.    Marland Kitchen insulin glargine (LANTUS) 100 UNIT/ML injection Inject 40 Units into the skin 2 (two) times daily.     .  isosorbide mononitrate (IMDUR) 30 MG 24 hr tablet Take 30 mg by mouth daily.    . metFORMIN (GLUCOPHAGE) 500 MG tablet Take by mouth. 500mg  in the morning and 1000 mg at night    . Multiple Vitamin (MULTIVITAMIN) tablet Take 1 tablet by mouth daily.    Marland Kitchen triamterene-hydrochlorothiazide (MAXZIDE-25) 37.5-25 MG per tablet Take 0.5 tablets by mouth daily. 15 tablet 6  . vitamin B-12 (CYANOCOBALAMIN) 1000 MCG tablet Take 1,000 mcg by mouth daily.    . vitamin E 400 UNIT capsule Take 400 Units by mouth daily. Takes occasionally     No current facility-administered medications for this visit.    SOCHX is notable that he is divorced. He does not have children he did have family issues in the past particularly with his brother. This seems to be improving. There is no tobacco or alcohol use.  ROS General: Negative; No fevers, chills, or night sweats;  HEENT: Negative; No changes in vision or hearing, sinus congestion, difficulty swallowing Pulmonary: Negative; No cough, wheezing, shortness of breath, hemoptysis Cardiovascular: See history of present illness No chest pain, presyncope, syncope, palpitations GI: Negative; No nausea, vomiting, diarrhea, or abdominal pain GU: Negative; No dysuria, hematuria, or difficulty voiding Musculoskeletal: Negative; no myalgias, joint pain, or weakness Hematologic/Oncology: Negative; no easy bruising, bleeding Endocrine: Negative; no heat/cold intolerance; no diabetes Neuro: Negative; no changes in balance, headaches Skin: Negative; No rashes or skin lesions Psychiatric: Negative; No behavioral problems, depression Sleep: Positive for obstructive sleep apnea on CPAP therapy with 100% compliance; No snoring, daytime sleepiness, hypersomnolence, bruxism, restless legs, hypnogognic hallucinations, no cataplexy Other comprehensive 14 point system review is negative.  PE BP 122/70 mmHg  Pulse 66  Ht 5\' 10"  (1.778 m)  Wt 213 lb 3.2 oz (96.707 kg)  BMI 30.59 kg/m2   General: Alert, oriented, no distress.  HEENT: Normocephalic, atraumatic. Pupils round and reactive; sclera anicteric; Fundi mild arteriolar narrowing. No xanthelasmas Nose without nasal septal hypertrophy Mouth/Parynx benign; Mallinpatti scale 3 Neck: No JVD, no carotid bruits with normal carotid upstroke Chest wall: Nontender to palpation Lungs: clear to ausculatation and percussion; no wheezing or rales Heart: RRR, s1 s2 normal 1/6 systolic murmur, no S3 or S4 gallop; no rubs thrills or heaves Abdomen: soft, nontender; no hepatosplenomehaly, BS+; abdominal aorta nontender and not dilated by palpation. Back: No CVA tenderness Pulses 2+ Extremities: no clubbinbg cyanosis or edema, Homan's sign negative  Neurologic: grossly nonfocal, cranial nerves grossly normal. Psychological: Normal affect and mood   ECG (independently interpreted by me): Normal sinus rhythm at 66 bpm.  Early transition.  Nonspecific T-wave changes V4 through V6 and in leads 2 and aVF.  ECG (independently interpreted by me):  Normal sinus rhythm. Nonspecific T changes. Normal intervals.    LABS:  BMET No results found for: NA   Hepatic Function Panel  No results found for: PROT   CBC No results found for: WBC   BNP No results found for: PROBNP  Lipid Panel  No results found for: CHOL   RADIOLOGY: No results found.    ASSESSMENT AND PLAN: Mr. Simonis has established coronary artery disease documented over 31 years when he presented with his inferior wall myocardial infarction in 1985 was found to have total RCA occlusion. He also is status post remote PTCA of the circumflex in 1990, and in 1991 underwent [place] of an eccentrically calcified LAD stenosis. His last intervention in 2005 was done to his LAD and first diagonal vessel which was stented. He has continued to be stable without recurrent anginal symptomatology. His blood pressure today is well-controlled on his medical regimen consisting of  amlodipine 5 mg, benazepril 20 mg, carvedilol 25 mg twice a day, maxide and isosorbide mononitrate 30 mg.  He tells me oftentimes he takes his blood pressure in the morning and it is elevated.  I have suggested that he take his amlodipine at bedtime rather than taking all his blood pressure medications in the morning may improve his early morning blood pressure reading.  He is not having any anginal symptomatology with this regimen.  He is on lipid-lowering therapy with atorvastatin 40 mg. He tells me his primary primary physician , Dr. Jacklynn Lewis recently checked laboratory at [facility].  Last year, his LDL was excellent at 60 on current therapy.  He continues to feel improved with 100% compliance of his CPAP therapy.  He denies any residual daytime sleepiness.  There is no breakthrough snoring.  He feels that initiation of therapy has been a huge positive.  In reference to his feeling well.  His weight today is 213 pounds in body mass index 30.59 which is compatible with mild obesity.  I discussed importance of exercising for 5 days a week for at least 30 minutes if possible. We discussed weight loss and diet.  I will see him in one year for reevaluation or sooner if problems arise.  Time spent: 25 minutes  Troy Sine, MD, [facility]  09/22/2014 3:00 PM

## 2014-09-28 ENCOUNTER — Telehealth: Payer: Self-pay | Admitting: Cardiovascular Disease

## 2014-09-28 NOTE — Telephone Encounter (Signed)
Please call,concerning his Celebrax.

## 2014-09-28 NOTE — Telephone Encounter (Signed)
Seen by Dr. Claiborne Billings last week. Patient had taken advisement that Celebrex 200mg  dose might need to be reduced, he called PCP. They have apparently tried to contact us for clarification on this.   Note pt has been on this medication daily since June 2015 for pain r/t back injury. I recommended no change at this time, since apparently he notes significant increase in pain if doses are missed and this medication has been helping w/ quality of life.  I contacted PCP office, they advised that the 100mg  daily dose did not help patient any w/ pain control. They prefer not to use narcotics. Wanted to know if benefits outweighed risks w/ cutting dose or trying different NSAID.   Will route to Dr. Claiborne Billings for any recommendations.

## 2014-09-30 NOTE — Telephone Encounter (Signed)
Ok to continue

## 2014-09-30 NOTE — Telephone Encounter (Signed)
Communicated Dr. Evette Georges recommendation to patient, he voiced understanding.

## 2014-10-15 ENCOUNTER — Other Ambulatory Visit: Payer: Self-pay | Admitting: Cardiovascular Disease

## 2014-10-17 NOTE — Telephone Encounter (Signed)
Rx(s) sent to pharmacy electronically.  

## 2014-12-10 ENCOUNTER — Telehealth: Payer: Self-pay | Admitting: Internal Medicine

## 2014-12-10 NOTE — Telephone Encounter (Signed)
Obtain a CPAP download if not done recently

## 2014-12-10 NOTE — Telephone Encounter (Signed)
Pt called complaining of sleepiness going on for the last 1-2 week. He reported that his systolic blood pressure ranges around 240-973 systolic. Review of previous notes indicates blood pressure around 532-992 systolic. Patient been checking his blood sugar that's been ranging around 1 8190. No episodes of hypoglycemia or more significant hyperglycemia. Patient also mentioned that he C Pap machine may need to be looked at. Patient denied chest pains, shortness of breath, dyspnea on exertion, lower extremity edema, bleeding, chills, fevers, nausea, vomiting, diarrhea. Patient was advised to call on Monday to Drs. Kelly's office to discuss blood pressure control and management of his sleep apnea. He was also advised to talk to Dr. Posey Pronto about his diabetes management. We'll forward this note to Dr. Claiborne Billings

## 2014-12-12 ENCOUNTER — Telehealth: Payer: Self-pay | Admitting: Cardiovascular Disease

## 2014-12-12 NOTE — Telephone Encounter (Signed)
Pt wanted you to know that he felt much better after he put on his new mask.

## 2014-12-14 ENCOUNTER — Telehealth: Payer: Self-pay | Admitting: Cardiovascular Disease

## 2014-12-14 NOTE — Telephone Encounter (Signed)
Please call him again

## 2014-12-14 NOTE — Telephone Encounter (Signed)
Please call,he thinks the Carvedilol is making him feel bad.

## 2014-12-14 NOTE — Telephone Encounter (Signed)
Spoke to patient. He had recently been to see pharmacist who had attributed his symptoms of general malaise, dizziness, lightheadedness, etc to carvedilol.  Symptoms have persisted for "a while". (months) he reports this is worse with the warm weather. The dizziness he notes usually comes w/ standing.  Pt reports he initially attributed to diabetes. He reports CBGs between 120s-140s typically.  He also notes recently refit for a CPAP mask and this has improved his overall sleep pattern.  Indicates that he checks CBG, BP in AM and then takes meds. He does occasionally recheck BP in afternoon but notes it falls in same ranges as AM checks.   VS past 4 days: 138/76  80 HR 144/67  71 HR 162/77  74 HR 149/71  71 HR  I discussed w/ patient that I would defer to Dr. Claiborne Billings on how to advise - does not appear that carvedilol is causing BPs to run too low, not sure if this would explain the dizziness - advised gradual position changes regardless.  Will defer to Dr. Claiborne Billings for advice.

## 2014-12-16 NOTE — Telephone Encounter (Signed)
Pt is calling back to f/u abou the carvedilol medication . Please call back   Thanks

## 2014-12-18 ENCOUNTER — Other Ambulatory Visit: Payer: Self-pay | Admitting: Cardiovascular Disease

## 2014-12-19 MED ORDER — CARVEDILOL 12.5 MG PO TABS: 12.5000 mg | ORAL_TABLET | Freq: Two times a day (BID) | ORAL | Status: DC

## 2014-12-19 NOTE — Telephone Encounter (Signed)
Patient advised on Dr. Evette Georges recommendations.  Med rx for new dosing sent per pt's request.  Advised to continue BP, HR checks, monitor for changes.

## 2014-12-19 NOTE — Telephone Encounter (Signed)
Rx has been sent to the pharmacy electronically. ° °

## 2014-12-19 NOTE — Telephone Encounter (Signed)
Can try to reduce coreg to 12.5 mg bid

## 2014-12-26 ENCOUNTER — Telehealth: Payer: Self-pay | Admitting: Cardiovascular Disease

## 2014-12-26 NOTE — Telephone Encounter (Signed)
Dr. Creig Hines called in stating that the pt would like to start taking Viagra and he would like to know if Dr. Claiborne Billings approves of this before he prescribed the medication. Please call back  Thanks

## 2015-01-02 NOTE — Telephone Encounter (Signed)
Has this been taken care of?

## 2015-01-08 NOTE — Telephone Encounter (Signed)
Not on nitrates or alpha blocker; ok to prescribe

## 2015-01-09 NOTE — Telephone Encounter (Signed)
2x attempts to call physician's office back today - was placed on hold x5-10 minutes. Will attempt return call later in the week.

## 2015-01-11 ENCOUNTER — Telehealth: Payer: Self-pay | Admitting: Physician Assistant

## 2015-01-11 NOTE — Telephone Encounter (Signed)
Called Dr. Pernell Dupre office - Left message w/ reception instructing OK for patient to be on viagra - to call if any questions.

## 2015-01-11 NOTE — Telephone Encounter (Signed)
Patient contacted the after hour service for advise as he has been week without energy and sleepy and wondering if it could have been the medications causing the issue. Per pt, he told Dr. Claiborne Billings who cut his coreg down to 12.5mg  BID. I reviewed his medication list, both coreg and gabapentin can potentially cause his symptom, he is currently taking 2 pills of gabapentin at night time. I have advised him to only take 1 pill and see if his symptom improve.   Hilbert Corrigan PA Pager: [phone]

## 2015-02-15 ENCOUNTER — Telehealth: Payer: Self-pay | Admitting: *Deleted

## 2015-02-15 NOTE — Telephone Encounter (Signed)
Returned signed order for CPAP face mask to  [facility].

## 2015-02-15 NOTE — Telephone Encounter (Signed)
[facility] orders faxed

## 2015-02-21 ENCOUNTER — Telehealth: Payer: Self-pay | Admitting: Cardiovascular Disease

## 2015-02-21 MED ORDER — CARVEDILOL 12.5 MG PO TABS: 12.5000 mg | ORAL_TABLET | Freq: Two times a day (BID) | ORAL | Status: DC

## 2015-02-21 NOTE — Telephone Encounter (Signed)
Refill submitted to patient's preferred pharmacy.  

## 2015-02-21 NOTE — Telephone Encounter (Signed)
°  1. Which medications need to be refilled?Carvediolol 12.5 needs a new prescription   2. Which pharmacy is medication to be sent to?[facility]   3. Do they need a 30 day or 90 day supply? 30  4. Would they like a call back once the medication has been sent to the pharmacy? No

## 2015-03-01 ENCOUNTER — Telehealth: Payer: Self-pay | Admitting: Cardiovascular Disease

## 2015-03-01 NOTE — Telephone Encounter (Signed)
Pt says he is having problems with his legs,he thinks it is coming from the Atorvastatin. He wants to know if there is something else he can take?

## 2015-03-01 NOTE — Telephone Encounter (Signed)
Pt. States hes been having a lot of pain in his legs and thinks it is coming from his lipitor, pt. Instructed to stop taking med for 30 days and let us know if it goes away, pt. Agreed with plan

## 2015-03-07 ENCOUNTER — Telehealth: Payer: Self-pay | Admitting: Cardiovascular Disease

## 2015-03-07 NOTE — Telephone Encounter (Signed)
Please call,he wants to know if he can use another company other than [employer]. This is for his C Pap supplies.

## 2015-03-09 ENCOUNTER — Telehealth: Payer: Self-pay | Admitting: *Deleted

## 2015-03-09 NOTE — Telephone Encounter (Signed)
Returned a call to patient informing him that I can try to find a company that will take his insurance.( I called [facility] and they do not.)The patient is upset with [facility] stating that he is having trouble getting his supplies from them.

## 2015-03-09 NOTE — Telephone Encounter (Signed)
Referred patient to [facility] to manage CPAP and supplies.

## 2015-03-27 ENCOUNTER — Telehealth: Payer: Self-pay | Admitting: Cardiovascular Disease

## 2015-03-27 NOTE — Telephone Encounter (Signed)
Patient advised to call [facility] as a referral was placed to them on 7/14 to check on status of this. Phone number given to patient.   [facility]  [address] [place],  [address]  [phone] phone

## 2015-03-27 NOTE — Telephone Encounter (Signed)
Spoke with patient regarding CPAP machine. Will ask Mariann Laster about his supplies/referral

## 2015-03-27 NOTE — Telephone Encounter (Signed)
Per the Answering Service: Pt wants another company for his C Pap machine. Pt said he caledl previously about this.

## 2015-04-02 ENCOUNTER — Telehealth: Payer: Self-pay | Admitting: Cardiology

## 2015-04-02 NOTE — Telephone Encounter (Signed)
Pt calls with feeling of fatigue and wanting to change meds.  Instructed to take imdur at night after 2 weeks, if no better then change HCTZ to night as WELL - but will ask office to arrange appt with dr. Claiborne Billings. Pt agreeable.  Also instructed to wear CPAP

## 2015-04-03 ENCOUNTER — Telehealth: Payer: Self-pay | Admitting: Cardiovascular Disease

## 2015-04-03 NOTE — Telephone Encounter (Signed)
Isaiah Serge, NP at 04/02/2015 4:46 PM     Status: Signed       Expand All Collapse All   Pt calls with feeling of fatigue and wanting to change meds. Instructed to take imdur at night after 2 weeks, if no better then change HCTZ to night as WELL - but will ask office to arrange appt with dr. Claiborne Billings. Pt agreeable. Also instructed to wear CPAP       Message handled by L. Dorene Ar, NP on 04/02/15

## 2015-04-03 NOTE — Telephone Encounter (Signed)
Per answering service:  Pt called in on 8/7 stating that he feels that one of his medications are making him sleep and would like to know if he could start taking it at night instead of taking it in the morning. Please call and advise   Thanks

## 2015-04-03 NOTE — Telephone Encounter (Signed)
Tried to call patient to schedule appt with TK in 2 months per Mickel Baas.  Unable to leave message.

## 2015-04-04 NOTE — Telephone Encounter (Signed)
Would not take diuretic at night due to urination affecting sleep.  If significant fatigue has continued can reduce carvedilol dose by 1/2

## 2015-04-11 ENCOUNTER — Ambulatory Visit (INDEPENDENT_AMBULATORY_CARE_PROVIDER_SITE_OTHER): Payer: PPO | Admitting: Cardiovascular Disease

## 2015-04-11 VITALS — BP 142/74 | HR 77 | Ht 69.0 in | Wt 216.6 lb

## 2015-04-11 DIAGNOSIS — E785 Hyperlipidemia, unspecified: Secondary | ICD-10-CM | POA: Diagnosis not present

## 2015-04-11 DIAGNOSIS — G4730 Sleep apnea, unspecified: Secondary | ICD-10-CM

## 2015-04-11 DIAGNOSIS — I25810 Atherosclerosis of coronary artery bypass graft(s) without angina pectoris: Secondary | ICD-10-CM | POA: Diagnosis not present

## 2015-04-11 DIAGNOSIS — Z79899 Other long term (current) drug therapy: Secondary | ICD-10-CM

## 2015-04-11 DIAGNOSIS — E119 Type 2 diabetes mellitus without complications: Secondary | ICD-10-CM

## 2015-04-11 DIAGNOSIS — I10 Essential (primary) hypertension: Secondary | ICD-10-CM

## 2015-04-11 DIAGNOSIS — G4733 Obstructive sleep apnea (adult) (pediatric): Secondary | ICD-10-CM

## 2015-04-11 DIAGNOSIS — Z9989 Dependence on other enabling machines and devices: Secondary | ICD-10-CM

## 2015-04-11 DIAGNOSIS — I119 Hypertensive heart disease without heart failure: Secondary | ICD-10-CM

## 2015-04-11 NOTE — Patient Instructions (Signed)
Your physician recommends that you return for lab work FASTING.  Your physician wants you to follow-up in: 9 months with Dr. Claiborne Billings. You will receive a reminder letter in the mail two months in advance. If you don't receive a letter, please call our office to schedule the follow-up appointment.

## 2015-04-12 ENCOUNTER — Encounter: Payer: Self-pay | Admitting: Cardiovascular Disease

## 2015-04-12 LAB — COMPREHENSIVE METABOLIC PANEL
ALT: 34 U/L (ref 9–46)
AST: 27 U/L (ref 10–35)
Albumin: 4.1 g/dL (ref 3.6–5.1)
Alkaline Phosphatase: 59 U/L (ref 40–115)
BUN: 14 mg/dL (ref 7–25)
CALCIUM: 9.7 mg/dL (ref 8.6–10.3)
CHLORIDE: 107 mmol/L (ref 98–110)
CO2: 22 mmol/L (ref 20–31)
Creat: 0.84 mg/dL (ref 0.70–1.25)
GLUCOSE: 144 mg/dL — AB (ref 65–99)
POTASSIUM: 4.3 mmol/L (ref 3.5–5.3)
Sodium: 140 mmol/L (ref 135–146)
Total Bilirubin: 0.5 mg/dL (ref 0.2–1.2)
Total Protein: 6.3 g/dL (ref 6.1–8.1)

## 2015-04-12 LAB — TSH: TSH: 1.497 u[IU]/mL (ref 0.350–4.500)

## 2015-04-12 LAB — CBC
HEMATOCRIT: 43.3 % (ref 39.0–52.0)
Hemoglobin: 14.8 g/dL (ref 13.0–17.0)
MCH: 31.2 pg (ref 26.0–34.0)
MCHC: 34.2 g/dL (ref 30.0–36.0)
MCV: 91.2 fL (ref 78.0–100.0)
MPV: 9.5 fL (ref 8.6–12.4)
Platelets: 179 10*3/uL (ref 150–400)
RBC: 4.75 MIL/uL (ref 4.22–5.81)
RDW: 13.7 % (ref 11.5–15.5)
WBC: 7.4 10*3/uL (ref 4.0–10.5)

## 2015-04-12 LAB — LIPID PANEL
CHOL/HDL RATIO: 3.2 ratio (ref <=5.0)
Cholesterol: 157 mg/dL (ref 125–200)
HDL: 49 mg/dL (ref >=40)
LDL CALC: 84 mg/dL (ref <130)
TRIGLYCERIDES: 118 mg/dL (ref <150)
VLDL: 24 mg/dL (ref <30)

## 2015-04-12 LAB — HEMOGLOBIN A1C
Hgb A1c MFr Bld: 7.2 % — ABNORMAL HIGH (ref <5.7)
Mean Plasma Glucose: 160 mg/dL — ABNORMAL HIGH (ref <117)

## 2015-04-12 NOTE — Progress Notes (Signed)
Patient ID: Brandon Herrera, male   DOB: Feb 06, 1946, 69 y.o.   MRN: [id number]    HPI: Brandon Herrera is a 69 y.o. male who presents to the office today for a 8 month cardiology followup evaluation.  [name]  has known CAD and suffered in an inferior myocardial infarction in 1985.  He was found to have total RCA occlusion. In 1990 he underwent PTCA of the circumflex and in 1991 directional coronary atherectomy of an eccentric ossified LAD stenosis. He is also status post interventions to circumflex coronary artery with his last intervention in 2005 at which time he also had a intervention to the LAD and first diagonal vessel. He has a history of type 2 diabetes mellitus, hypertension, and hyperlipidemia.  He also has remote history of prostate CA and is status post prostate seed implantation  In 2011 a sleep study demonstrated mild sleep apnea overall with an AHI of 7.44/hr but sleep apnea was moderate at 16.4/hr with REM sleep. He  dropped his oxygen saturation to 88%. He has been using CPAP therapy since 2011 and notes huge difference is in his sense of well-being.  A download from March 2013 through June 2013 showed 100% usage, averaging 7 hours and 29 minutes per night. At that time, his AHI was 3.5 per hour. He presently denies any breakthrough snoring. He denies residual daytime sleepiness. He denies restless legs.  Since I last saw him, he has continued to remain fairly stable.  He specifically denies chest pain or shortness of breath.  He is using CPAP and is with 100% compliance and his sleep is restorative.  He is unaware of palpitations.  He denies bleeding.  He denies PND, orthopnea.  He is diabetic taking insulin in addition to metformin.  He presents for evaluation.   Past Medical History  Diagnosis Date  . CAD (coronary artery disease)   . Inferior MI 1985    totalled RCA  . Hypertension   . Diabetes mellitus   . Hyperlipidemia   . OSA on CPAP   . Prostate cancer     Seed implant  .  Heart murmur     07/28/2008 ECHO: mild mitral annular ca+,AOV mildly sclerotic,mild LVH,mod.global hypokinesis,mild to mod post wall hypokinesis, EF 35-40%,LA mildly dilated  . Abnormal nuclear stress test 03/03/2012    mod size inferior scar w/new anterolateral wall ischemia towards apex    Past Surgical History  Procedure Laterality Date  . Ptca  1990    CX  . Percutaneous coronary rotoblator intervention (pci-r)  1991    LAD  . Coronary angioplasty with stent placement  1995    CX & LAD  . Coronary angioplasty with stent placement  12/25/2001    LCX  . Cardiac catheterization  02/11/2003    patent CX stent,chronically occluded RCA  . Coronary angioplasty with stent placement  11/07/2003    CX, planned stenting of LAD later  . Coronary angioplasty with stent placement  12/05/2003    LAD  . Cardiac catheterization  10/29/2004    No evidence of restenosis LAD but 30-40% narrowing prox. to stent,widely patient CX, old subtotalled RCA    Allergies  Allergen Reactions  . Zetia [Ezetimibe] Other (See Comments)    myalgias    Current Outpatient Prescriptions  Medication Sig Dispense Refill  . amLODipine (NORVASC) 5 MG tablet Take 5 mg by mouth daily.    Marland Kitchen aspirin 81 MG tablet Take 81 mg by mouth daily.    Marland Kitchen  atorvastatin (LIPITOR) 40 MG tablet Take 40 mg by mouth daily.    . benazepril (LOTENSIN) 20 MG tablet Take 1 tablet (20 mg total) by mouth daily. 30 tablet 11  . Biotin 5000 MCG CAPS Take 1 capsule by mouth daily.    . carvedilol (COREG) 12.5 MG tablet Take 1 tablet (12.5 mg total) by mouth 2 (two) times daily with a meal. 60 tablet 5  . celecoxib (CELEBREX) 200 MG capsule Take 1 capsule by mouth daily.    . clopidogrel (PLAVIX) 75 MG tablet Take 75 mg by mouth daily.    . Fish Oil-Cholecalciferol (FISH OIL + D3) 1000-1000 MG-UNIT CAPS Take 1 capsule by mouth daily.    . fish oil-omega-3 fatty acids 1000 MG capsule Take 1 g by mouth daily. 1-2 daily    . gabapentin (NEURONTIN) 100 MG  capsule Take 2 capsules by mouth daily.  5  . glucose blood (ONE TOUCH ULTRA TEST) test strip Inject 1 strip as directed 2 (two) times daily.    . insulin aspart protamine- aspart (NOVOLOG MIX 70/30) (70-30) 100 UNIT/ML injection Inject 40 Units into the skin.    Marland Kitchen insulin glargine (LANTUS) 100 UNIT/ML injection Inject 40 Units into the skin 2 (two) times daily.     . insulin NPH-regular Human (NOVOLIN 70/30) (70-30) 100 UNIT/ML injection Inject 45 Units into the skin 2 (two) times daily.    . isosorbide mononitrate (IMDUR) 30 MG 24 hr tablet Take 30 mg by mouth daily.    Marland Kitchen loratadine (CLARITIN) 10 MG tablet Take 10 mg by mouth daily.    . metFORMIN (GLUCOPHAGE) 500 MG tablet Take by mouth. 500mg  in the morning and 1000 mg at night    . Multiple Vitamin (MULTIVITAMIN) tablet Take 1 tablet by mouth daily.    Marland Kitchen triamterene-hydrochlorothiazide (MAXZIDE-25) 37.5-25 MG per tablet Take 0.5 tablets by mouth daily. 15 tablet 11  . vitamin B-12 (CYANOCOBALAMIN) 1000 MCG tablet Take 1,000 mcg by mouth daily.    . vitamin E 400 UNIT capsule Take 400 Units by mouth daily. Takes occasionally     No current facility-administered medications for this visit.    Social history is notable in that he is divorced. He does not have children he did have family issues in the past particularly with his brother.  There is no tobacco or alcohol use.  ROS General: Negative; No fevers, chills, or night sweats;  HEENT: Negative; No changes in vision or hearing, sinus congestion, difficulty swallowing Pulmonary: Negative; No cough, wheezing, shortness of breath, hemoptysis Cardiovascular: See history of present illness No chest pain, presyncope, syncope, palpitations GI: Negative; No nausea, vomiting, diarrhea, or abdominal pain GU: Negative; No dysuria, hematuria, or difficulty voiding Musculoskeletal: Negative; no myalgias, joint pain, or weakness Hematologic/Oncology: Negative; no easy bruising, bleeding Endocrine:  Negative; no heat/cold intolerance; no diabetes Neuro: Negative; no changes in balance, headaches Skin: Negative; No rashes or skin lesions Psychiatric: Negative; No behavioral problems, depression Sleep: Positive for obstructive sleep apnea on CPAP therapy with 100% compliance; No snoring, daytime sleepiness, hypersomnolence, bruxism, restless legs, hypnogognic hallucinations, no cataplexy Other comprehensive 14 point system review is negative.  PE BP 142/74 mmHg  Pulse 77  Ht 5\' 9"  (1.753 m)  Wt 216 lb 9.6 oz (98.249 kg)  BMI 31.97 kg/m2   Wt Readings from Last 3 Encounters:  04/11/15 216 lb 9.6 oz (98.249 kg)  09/21/14 213 lb 3.2 oz (96.707 kg)  09/20/13 202 lb 11.2 oz (91.944 kg)  General: Alert, oriented, no distress.  HEENT: Normocephalic, atraumatic. Pupils round and reactive; sclera anicteric; Fundi mild arteriolar narrowing. No xanthelasmas Nose without nasal septal hypertrophy Mouth/Parynx benign; Mallinpatti scale 3 Neck: No JVD, no carotid bruits with normal carotid upstroke Chest wall: Nontender to palpation Lungs: clear to ausculatation and percussion; no wheezing or rales Heart: RRR, s1 s2 normal 1/6 systolic murmur, no S3 or S4 gallop; no rubs thrills or heaves Abdomen: Moderate diastases recti; soft, nontender; no hepatosplenomehaly, BS+; abdominal aorta nontender and not dilated by palpation. Back: No CVA tenderness Pulses 2+ Extremities: no clubbinbg cyanosis or edema, Homan's sign negative  Neurologic: grossly nonfocal, cranial nerves grossly normal. Psychological: Normal affect and mood   ECG (independently read by me): Normal sinus rhythm at 77 bpm.,  Q-wave in lead 3.  Previously noted T-wave abnormality V4 through V6.  January 2016 ECG (independently interpreted by me): Normal sinus rhythm at 66 bpm.  Early transition.  Nonspecific T-wave changes V4 through V6 and in leads 2 and aVF.  January 2015 ECG (independently interpreted by me): Normal sinus  rhythm. Nonspecific T changes. Normal intervals.    LABS: He tells me his primary physician, Dr. Doyle Askew, with [facility] has been checking his laboratory.  BMET No results found for: NA   Hepatic Function Panel  No results found for: PROT   CBC No results found for: WBC   BNP No results found for: PROBNP  Lipid Panel  No results found for: CHOL   RADIOLOGY: No results found.    ASSESSMENT AND PLAN: Mr. Sames is a 69 year old white male who has established coronary artery disease documented for  31 years when he presented with his inferior wall myocardial infarction in 1985 was found to have total RCA occlusion. He also is status post remote PTCA of the circumflex in 1990, and in 1991 underwent [place] of an eccentrically calcified LAD stenosis. His last intervention in 2005 was done to his LAD and first diagonal vessel which was stented. He has continued to be stable without recurrent anginal symptomatology.  He has a history of hypertension.  His blood pressure today is controlled on his medical regimen consisting of amlodipine 5 mg, benazepril 20 mg, carvedilol 12.5 mg twice a day, maxide and isosorbide mononitrate 30 mg.  he is not having any anginal symptomatology on his current regimen.  He continues to take 2 and a platelet therapy with aspirin and Plavix and is tolerating this well without bleeding.  He is diabetic on insulin and metformin.  He has a peripheral neuropathy for which he takes Neurontin.  He has hyperlipidemia and is on atorvastatin 40 mg in addition to omega-3 fatty acids.  I will try to obtain blood work which has been done by his primary physician at [facility].  He continues to use CPAP with 100% compliance and his sleep is restorative without breakthrough snoring.  As long as he remains stable, I will see him in 9 months for reevaluation.    Time spent: 25 minutes  Troy Sine, MD, [facility]  04/12/2015 7:53 PM

## 2015-04-18 ENCOUNTER — Encounter: Payer: Self-pay | Admitting: *Deleted

## 2015-04-21 ENCOUNTER — Telehealth: Payer: Self-pay | Admitting: Cardiovascular Disease

## 2015-04-21 MED ORDER — VALSARTAN 160 MG PO TABS: 160.0000 mg | ORAL_TABLET | Freq: Every day | ORAL | Status: DC

## 2015-04-21 NOTE — Telephone Encounter (Signed)
Spoke with patient. He reports a cough for about 1 week. He states it is somewhat productive and he can get some phlegm up.   He asked if his medications could cause this - takes benazepril.   Informed him that it sounds more like URI related and he has a PCP OV on 8/30.   Informed him I would consult Erasmo Downer for advice and call him back

## 2015-04-21 NOTE — Telephone Encounter (Signed)
Pt have been having a cough,wonder if it might be from his medicine?

## 2015-04-21 NOTE — Telephone Encounter (Signed)
Explained medication change to patient and he is agreeable. He will try valsartan 160mg  for 2 weeks and if cough is no better he should seek eval from PCP. If cough better, likely from ACE-I.  Rx(s) sent to pharmacy electronically.

## 2015-04-21 NOTE — Telephone Encounter (Signed)
Usually cough with ACEI is dry tickle, but still could be cause.  Switch him to valsartan 160 mg once daily, stop benazepril.

## 2015-04-25 DIAGNOSIS — G8929 Other chronic pain: Secondary | ICD-10-CM | POA: Insufficient documentation

## 2015-05-24 ENCOUNTER — Telehealth: Payer: Self-pay | Admitting: Cardiovascular Disease

## 2015-05-24 NOTE — Telephone Encounter (Signed)
Please call,have some question related to his diabetes.

## 2015-05-24 NOTE — Telephone Encounter (Signed)
Discussed blood sugar level fluctuations w/ patient, fluctuations throughout the day. He identified nothing concerning, notes CBGs remain w/in expected ranges. All questions addressed to satisfaction.  Advised for anything further to contact Dr. Doyle Askew who is in charge of his diabetes care. Pt expressed thanks for the call and general advice.

## 2015-06-13 ENCOUNTER — Telehealth: Payer: Self-pay | Admitting: Cardiovascular Disease

## 2015-06-15 NOTE — Telephone Encounter (Signed)
Close encounter 

## 2015-07-26 DIAGNOSIS — I252 Old myocardial infarction: Secondary | ICD-10-CM | POA: Insufficient documentation

## 2015-07-26 DIAGNOSIS — E1140 Type 2 diabetes mellitus with diabetic neuropathy, unspecified: Secondary | ICD-10-CM | POA: Insufficient documentation

## 2015-07-27 ENCOUNTER — Telehealth: Payer: Self-pay | Admitting: Cardiovascular Disease

## 2015-07-27 NOTE — Telephone Encounter (Signed)
Rings w no answer, automated pickup by [employer] VM service w/ no voice mailbox set up.

## 2015-07-27 NOTE — Telephone Encounter (Signed)
Mr. Ferrelli is calling to find out if it is ok for him to take the Celebrex and his primary say's  he should take 500 mg Tylenol   .Marland Kitchen Please call

## 2015-08-15 ENCOUNTER — Telehealth: Payer: Self-pay | Admitting: Cardiovascular Disease

## 2015-08-15 MED ORDER — ISOSORBIDE MONONITRATE ER 30 MG PO TB24: 30.0000 mg | ORAL_TABLET | Freq: Every day | ORAL | Status: DC

## 2015-08-15 MED ORDER — CLOPIDOGREL BISULFATE 75 MG PO TABS: 75.0000 mg | ORAL_TABLET | Freq: Every day | ORAL | Status: DC

## 2015-08-15 MED ORDER — CARVEDILOL 12.5 MG PO TABS: 12.5000 mg | ORAL_TABLET | Freq: Two times a day (BID) | ORAL | Status: DC

## 2015-08-15 MED ORDER — VALSARTAN 160 MG PO TABS: 160.0000 mg | ORAL_TABLET | Freq: Every day | ORAL | Status: DC

## 2015-08-15 MED ORDER — ATORVASTATIN CALCIUM 40 MG PO TABS: 40.0000 mg | ORAL_TABLET | Freq: Every day | ORAL | Status: DC

## 2015-08-15 NOTE — Telephone Encounter (Signed)
°*  STAT* If patient is at the pharmacy, call can be transferred to refill team   1. Which medications need to be refilled? (please list name of each medication and dose if known) All his heart medications  2. Which pharmacy/location (including street and city if local pharmacy) is medication to be sent to? [place]   3. Do they need a 30 day or 90 day supply? [place]

## 2015-08-15 NOTE — Telephone Encounter (Signed)
Refills for cardiac medications submitted to patient's preferred local pharmacy.

## 2015-09-13 ENCOUNTER — Other Ambulatory Visit: Payer: Self-pay | Admitting: Cardiovascular Disease

## 2015-09-13 NOTE — Telephone Encounter (Signed)
Rx request sent to pharmacy.  

## 2015-10-23 DIAGNOSIS — Z85038 Personal history of other malignant neoplasm of large intestine: Secondary | ICD-10-CM | POA: Diagnosis not present

## 2015-10-23 DIAGNOSIS — W5589XA Other contact with other mammals, initial encounter: Secondary | ICD-10-CM | POA: Diagnosis not present

## 2015-10-23 DIAGNOSIS — S0990XA Unspecified injury of head, initial encounter: Secondary | ICD-10-CM | POA: Diagnosis not present

## 2015-10-23 DIAGNOSIS — Z7902 Long term (current) use of antithrombotics/antiplatelets: Secondary | ICD-10-CM | POA: Diagnosis not present

## 2015-10-23 DIAGNOSIS — W5522XA Struck by cow, initial encounter: Secondary | ICD-10-CM | POA: Diagnosis not present

## 2015-10-23 DIAGNOSIS — E785 Hyperlipidemia, unspecified: Secondary | ICD-10-CM | POA: Diagnosis not present

## 2015-10-23 DIAGNOSIS — S0093XA Contusion of unspecified part of head, initial encounter: Secondary | ICD-10-CM | POA: Diagnosis not present

## 2015-10-23 DIAGNOSIS — Z888 Allergy status to other drugs, medicaments and biological substances status: Secondary | ICD-10-CM | POA: Diagnosis not present

## 2015-10-23 DIAGNOSIS — Y93K9 Activity, other involving animal care: Secondary | ICD-10-CM | POA: Diagnosis not present

## 2015-10-23 DIAGNOSIS — Z7982 Long term (current) use of aspirin: Secondary | ICD-10-CM | POA: Diagnosis not present

## 2015-10-23 DIAGNOSIS — I10 Essential (primary) hypertension: Secondary | ICD-10-CM | POA: Diagnosis not present

## 2015-10-23 DIAGNOSIS — Z87891 Personal history of nicotine dependence: Secondary | ICD-10-CM | POA: Diagnosis not present

## 2015-10-23 DIAGNOSIS — Z79899 Other long term (current) drug therapy: Secondary | ICD-10-CM | POA: Diagnosis not present

## 2015-10-23 DIAGNOSIS — E119 Type 2 diabetes mellitus without complications: Secondary | ICD-10-CM | POA: Diagnosis not present

## 2015-10-23 DIAGNOSIS — Z791 Long term (current) use of non-steroidal anti-inflammatories (NSAID): Secondary | ICD-10-CM | POA: Diagnosis not present

## 2015-10-23 DIAGNOSIS — I252 Old myocardial infarction: Secondary | ICD-10-CM | POA: Diagnosis not present

## 2015-10-23 DIAGNOSIS — S01112A Laceration without foreign body of left eyelid and periocular area, initial encounter: Secondary | ICD-10-CM | POA: Diagnosis not present

## 2015-10-23 DIAGNOSIS — Z794 Long term (current) use of insulin: Secondary | ICD-10-CM | POA: Diagnosis not present

## 2015-10-23 DIAGNOSIS — Z23 Encounter for immunization: Secondary | ICD-10-CM | POA: Diagnosis not present

## 2015-10-23 DIAGNOSIS — S0181XA Laceration without foreign body of other part of head, initial encounter: Secondary | ICD-10-CM | POA: Diagnosis not present

## 2015-10-23 DIAGNOSIS — R51 Headache: Secondary | ICD-10-CM | POA: Diagnosis not present

## 2015-10-30 DIAGNOSIS — S01112D Laceration without foreign body of left eyelid and periocular area, subsequent encounter: Secondary | ICD-10-CM | POA: Diagnosis not present

## 2015-10-31 ENCOUNTER — Telehealth: Payer: Self-pay | Admitting: Cardiovascular Disease

## 2015-10-31 DIAGNOSIS — E785 Hyperlipidemia, unspecified: Secondary | ICD-10-CM | POA: Diagnosis not present

## 2015-10-31 DIAGNOSIS — Z794 Long term (current) use of insulin: Secondary | ICD-10-CM | POA: Diagnosis not present

## 2015-10-31 DIAGNOSIS — I10 Essential (primary) hypertension: Secondary | ICD-10-CM | POA: Diagnosis not present

## 2015-10-31 DIAGNOSIS — E1165 Type 2 diabetes mellitus with hyperglycemia: Secondary | ICD-10-CM | POA: Diagnosis not present

## 2015-10-31 DIAGNOSIS — T148 Other injury of unspecified body region: Secondary | ICD-10-CM | POA: Diagnosis not present

## 2015-10-31 DIAGNOSIS — E1140 Type 2 diabetes mellitus with diabetic neuropathy, unspecified: Secondary | ICD-10-CM | POA: Diagnosis not present

## 2015-10-31 NOTE — Telephone Encounter (Signed)
Returned call, no answer or VM pickup when dialed.

## 2015-10-31 NOTE — Telephone Encounter (Signed)
Pt called stating that he is he has several places on his lower extremities that have bruises and he is concern that these could be caused by the Plavix and Aspirin he is taking . Please advise and assist  Thanks

## 2015-11-02 NOTE — Telephone Encounter (Signed)
Got in touch with patient. PCP wanting to take pt off Celebrex. Pt needing OTC recommendation. Advised Tylenol PRN until seen by Dr. Claiborne Billings in April. He had some bruising stemming from injury by one of his cows, this has started to resolve. Advised to continue plavix and ASA as directed. Will route for any further advice.

## 2015-11-02 NOTE — Telephone Encounter (Signed)
Agree with recommendation

## 2015-11-03 ENCOUNTER — Telehealth: Payer: Self-pay | Admitting: Cardiovascular Disease

## 2015-11-03 NOTE — Telephone Encounter (Signed)
Spoke with pt, per pharm md, pt given the okay for aleve and the pt told to take the aleve for 2 days and then skip 2 days. Pt would like to know what dr Claiborne Billings also thinks, will forward for dr [facility] review.

## 2015-11-03 NOTE — Telephone Encounter (Signed)
New message      Pt states that he talked to the nurse a few days ago.  It was recommended that he take tylenol for his pain.  He says tylenol does not help and he did not sleep at all last night.  He had to stop celebrex because of abn urine test.  Calling to find out what else he can take stronger than tylenol.  Please call

## 2015-11-07 NOTE — Telephone Encounter (Signed)
Spoke with pt, aware of dr kelly's recommendations. 

## 2015-11-07 NOTE — Telephone Encounter (Signed)
ok 

## 2015-11-08 ENCOUNTER — Telehealth: Payer: Self-pay | Admitting: Cardiovascular Disease

## 2015-11-08 NOTE — Telephone Encounter (Signed)
New message    Pt wants rn to call him about his sleep machine

## 2015-11-09 ENCOUNTER — Other Ambulatory Visit: Payer: Self-pay | Admitting: *Deleted

## 2015-11-09 DIAGNOSIS — Z9989 Dependence on other enabling machines and devices: Principal | ICD-10-CM

## 2015-11-09 DIAGNOSIS — G4733 Obstructive sleep apnea (adult) (pediatric): Secondary | ICD-10-CM

## 2015-11-09 NOTE — Telephone Encounter (Signed)
Returned a call to patient. He complains of not being able to sleep. He's not sure if it is due to his CPAP machine or the fact that he stopped his celebrex. He states the nurse at his  PCP office told him the stopping of the celebrex could cause this. He says he doesn't know which it is. I told the patient that I will order a download to see if his machine pressures are correct and let him know if anything needs to be changed. Patient voiced understanding.

## 2015-11-15 ENCOUNTER — Telehealth: Payer: Self-pay | Admitting: *Deleted

## 2015-11-15 NOTE — Telephone Encounter (Signed)
Patient walked ito office with his CPAP machine. Per our last phone conversation I recommended that he gets a download. The patient thought that he would bring the machine here since he was over this way. I informed him that he needs to take the chip to [place] to have it downloaded. Patient voiced understanding.

## 2015-11-21 ENCOUNTER — Telehealth: Payer: Self-pay | Admitting: Cardiovascular Disease

## 2015-11-21 NOTE — Telephone Encounter (Deleted)
Patient wants results of sleep study

## 2015-11-21 NOTE — Telephone Encounter (Signed)
Patient wants results of CPAP download done last week.

## 2015-11-23 NOTE — Telephone Encounter (Signed)
Returned a call to patient. Received a VM message that states patient cannot take call at this time. Cannot leave a message. VM says to try call later.

## 2015-11-24 ENCOUNTER — Telehealth: Payer: Self-pay | Admitting: Cardiovascular Disease

## 2015-11-24 NOTE — Telephone Encounter (Signed)
Pt is calling in stating that for the past week his BP has been elevated today and he is concerned. He isn't sure what to do . Please f/u with him  Thanks

## 2015-11-24 NOTE — Telephone Encounter (Signed)
Answered questions about BP checking.  He noted elevated BPs today but not usual for him. No symptoms. Advised daily recheck & to call if continued elevations.  Pt also called needing download of CPAP sent to him or [facility] so that his DME will send out more supplies. Having an issue w/ cushion of mask being worn out. He is aware I will defer for help w/ this.

## 2015-11-25 ENCOUNTER — Telehealth: Payer: Self-pay | Admitting: Cardiology

## 2015-11-25 NOTE — Telephone Encounter (Signed)
Patient is calling because he is feeling tired and sleepy and is worried that it is his Gabapentin.  Instructed him to call his PCP about the gabapentin and call Dr. Claiborne Billings on Monday.

## 2015-11-26 NOTE — Telephone Encounter (Signed)
Ok

## 2015-11-27 ENCOUNTER — Other Ambulatory Visit: Payer: Self-pay | Admitting: *Deleted

## 2015-11-27 ENCOUNTER — Telehealth: Payer: Self-pay | Admitting: Cardiovascular Disease

## 2015-11-27 DIAGNOSIS — Z9989 Dependence on other enabling machines and devices: Principal | ICD-10-CM

## 2015-11-27 DIAGNOSIS — G4733 Obstructive sleep apnea (adult) (pediatric): Secondary | ICD-10-CM

## 2015-11-27 NOTE — Telephone Encounter (Signed)
[place]  Pt was told to f/u w/ Dr Claiborne Billings today- 4/3 concerning his complications w/ sleep machine. Please call back and discuss.

## 2015-11-27 NOTE — Telephone Encounter (Signed)
Returned call to patient.He stated PCP advised him to schedule appointment with Dr.Kelly.Stated he has several problems.Stated he wears cpap at night.He has been waking up for the past 4 to 5 mornings at 3:30 am.Stated he cannot go back to sleep.Stated he tires easy,no energy.Stated he was knocked down by a calf and she walked over him about a month ago.He had 12 sutures in head.He has no insurance so he did not have head scan done.Stated he don't know if this would be causing his problems.He does not have any headaches,no dizziness,no blurred vision.Stated when he drives his car he wants to fall asleep.He has appointment with Dr.Kelly 12/20/15,but would like to see Dr.Kelly sooner.Appointment scheduled with [facility] 11/28/15 at 11:00 am.

## 2015-11-27 NOTE — Telephone Encounter (Signed)
Order sent to [facility] to increase CPAP pressure to 11cmH2O

## 2015-11-27 NOTE — Telephone Encounter (Signed)
Spoke to [place] she received cpap download.She will be sending order to [facility] to increase pressure.

## 2015-11-27 NOTE — Telephone Encounter (Signed)
-----   Message from Troy Sine, MD sent at 11/25/2015  2:48 PM EDT ----- Excellent compliance and hrs used nightly; AHI mildly inc at 5.5/h; No leak;   Increase CPAP from 10 to 11 cm water pressure.; f/u download in several months

## 2015-11-28 ENCOUNTER — Telehealth: Payer: Self-pay | Admitting: Cardiovascular Disease

## 2015-11-28 ENCOUNTER — Ambulatory Visit (INDEPENDENT_AMBULATORY_CARE_PROVIDER_SITE_OTHER): Payer: PPO | Admitting: Cardiovascular Disease

## 2015-11-28 ENCOUNTER — Encounter: Payer: Self-pay | Admitting: Cardiovascular Disease

## 2015-11-28 VITALS — BP 150/80 | HR 85 | Ht 70.0 in | Wt 211.2 lb

## 2015-11-28 DIAGNOSIS — E785 Hyperlipidemia, unspecified: Secondary | ICD-10-CM | POA: Diagnosis not present

## 2015-11-28 DIAGNOSIS — I2510 Atherosclerotic heart disease of native coronary artery without angina pectoris: Secondary | ICD-10-CM | POA: Diagnosis not present

## 2015-11-28 DIAGNOSIS — Z9989 Dependence on other enabling machines and devices: Secondary | ICD-10-CM

## 2015-11-28 DIAGNOSIS — G4730 Sleep apnea, unspecified: Secondary | ICD-10-CM | POA: Diagnosis not present

## 2015-11-28 DIAGNOSIS — G4733 Obstructive sleep apnea (adult) (pediatric): Secondary | ICD-10-CM

## 2015-11-28 DIAGNOSIS — E1159 Type 2 diabetes mellitus with other circulatory complications: Secondary | ICD-10-CM

## 2015-11-28 DIAGNOSIS — I119 Hypertensive heart disease without heart failure: Secondary | ICD-10-CM | POA: Diagnosis not present

## 2015-11-28 MED ORDER — CARVEDILOL 12.5 MG PO TABS: 18.7500 mg | ORAL_TABLET | Freq: Two times a day (BID) | ORAL | Status: DC

## 2015-11-28 NOTE — Patient Instructions (Signed)
Your physician has recommended you make the following change in your medication:   1.) the carvedilol has been increased to 18.75 mg twice a day.  [facility] to [facility] with [facility] for pressure change.  Your physician wants you to follow-up in: 6 months or soone if needed. You will receive a reminder letter in the mail two months in advance. If you don't receive a letter, please call our office to schedule the follow-up appointment.

## 2015-11-28 NOTE — Telephone Encounter (Signed)
error 

## 2015-11-30 ENCOUNTER — Other Ambulatory Visit: Payer: Self-pay | Admitting: *Deleted

## 2015-11-30 ENCOUNTER — Encounter: Payer: Self-pay | Admitting: Cardiovascular Disease

## 2015-11-30 DIAGNOSIS — Z9989 Dependence on other enabling machines and devices: Principal | ICD-10-CM

## 2015-11-30 DIAGNOSIS — G4733 Obstructive sleep apnea (adult) (pediatric): Secondary | ICD-10-CM
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
## 2015-12-02 ENCOUNTER — Telehealth: Payer: Self-pay | Admitting: Cardiology

## 2015-12-02 NOTE — Telephone Encounter (Signed)
Pt called with feeling bad and very sleepy his BP 175/73 P-76 and recheck 169/ 76 P 76 glucose 128  Unsure what is causing the fatigue.  He will decrease the coreg back to regular and stop gabapentin.  Instructed to go to urgent care or ER if feels bad.  Will send to Dr. Corky Downs and office to be seen Monday.

## 2015-12-03 ENCOUNTER — Telehealth: Payer: Self-pay | Admitting: Cardiology

## 2015-12-03 DIAGNOSIS — Z9889 Other specified postprocedural states: Secondary | ICD-10-CM | POA: Diagnosis not present

## 2015-12-03 DIAGNOSIS — Z7902 Long term (current) use of antithrombotics/antiplatelets: Secondary | ICD-10-CM | POA: Diagnosis not present

## 2015-12-03 DIAGNOSIS — Z7982 Long term (current) use of aspirin: Secondary | ICD-10-CM | POA: Diagnosis not present

## 2015-12-03 DIAGNOSIS — Z87828 Personal history of other (healed) physical injury and trauma: Secondary | ICD-10-CM | POA: Diagnosis not present

## 2015-12-03 DIAGNOSIS — E119 Type 2 diabetes mellitus without complications: Secondary | ICD-10-CM | POA: Diagnosis not present

## 2015-12-03 DIAGNOSIS — S0990XA Unspecified injury of head, initial encounter: Secondary | ICD-10-CM | POA: Diagnosis not present

## 2015-12-03 DIAGNOSIS — Z888 Allergy status to other drugs, medicaments and biological substances status: Secondary | ICD-10-CM | POA: Diagnosis not present

## 2015-12-03 DIAGNOSIS — I10 Essential (primary) hypertension: Secondary | ICD-10-CM | POA: Diagnosis not present

## 2015-12-03 DIAGNOSIS — I252 Old myocardial infarction: Secondary | ICD-10-CM | POA: Diagnosis not present

## 2015-12-03 DIAGNOSIS — Z8546 Personal history of malignant neoplasm of prostate: Secondary | ICD-10-CM | POA: Diagnosis not present

## 2015-12-03 DIAGNOSIS — I2510 Atherosclerotic heart disease of native coronary artery without angina pectoris: Secondary | ICD-10-CM | POA: Diagnosis not present

## 2015-12-03 DIAGNOSIS — E785 Hyperlipidemia, unspecified: Secondary | ICD-10-CM | POA: Diagnosis not present

## 2015-12-03 DIAGNOSIS — Z87442 Personal history of urinary calculi: Secondary | ICD-10-CM | POA: Diagnosis not present

## 2015-12-03 DIAGNOSIS — R55 Syncope and collapse: Secondary | ICD-10-CM | POA: Diagnosis not present

## 2015-12-03 DIAGNOSIS — Z87891 Personal history of nicotine dependence: Secondary | ICD-10-CM | POA: Diagnosis not present

## 2015-12-03 DIAGNOSIS — Z79899 Other long term (current) drug therapy: Secondary | ICD-10-CM | POA: Diagnosis not present

## 2015-12-03 DIAGNOSIS — R42 Dizziness and giddiness: Secondary | ICD-10-CM | POA: Diagnosis not present

## 2015-12-03 DIAGNOSIS — Z955 Presence of coronary angioplasty implant and graft: Secondary | ICD-10-CM | POA: Diagnosis not present

## 2015-12-03 DIAGNOSIS — R531 Weakness: Secondary | ICD-10-CM | POA: Diagnosis not present

## 2015-12-03 DIAGNOSIS — G4700 Insomnia, unspecified: Secondary | ICD-10-CM | POA: Diagnosis not present

## 2015-12-03 DIAGNOSIS — Z794 Long term (current) use of insulin: Secondary | ICD-10-CM | POA: Diagnosis not present
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
## 2015-12-04 ENCOUNTER — Telehealth: Payer: Self-pay | Admitting: Cardiovascular Disease
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
## 2015-12-04 NOTE — Telephone Encounter (Signed)
Noted & routed to Dr. Claiborne Billings.

## 2015-12-12 NOTE — Addendum Note (Signed)
Addended by: Therisa Doyne on: 12/12/2015 04:13 PM   Modules accepted: Orders

## 2015-12-13 ENCOUNTER — Telehealth: Payer: Self-pay | Admitting: Cardiovascular Disease

## 2015-12-13 NOTE — Telephone Encounter (Signed)
New message      What can pt take to help him sleep?  He took benadryl for 4-5 days.  It makes him feel drowsy all day. Please call

## 2015-12-13 NOTE — Telephone Encounter (Signed)
Spoke with pt. He wants something to help him sleep. He says benadryl is making him too drowsy throughout the day. Encouraged pt to call his primary care for sleep medications. Pt verbalized understanding.

## 2015-12-15 DIAGNOSIS — Z794 Long term (current) use of insulin: Secondary | ICD-10-CM | POA: Diagnosis not present

## 2015-12-15 DIAGNOSIS — E1140 Type 2 diabetes mellitus with diabetic neuropathy, unspecified: Secondary | ICD-10-CM | POA: Diagnosis not present

## 2015-12-15 DIAGNOSIS — F5101 Primary insomnia: Secondary | ICD-10-CM | POA: Diagnosis not present

## 2015-12-15 DIAGNOSIS — I10 Essential (primary) hypertension: Secondary | ICD-10-CM | POA: Diagnosis not present

## 2015-12-18 NOTE — Telephone Encounter (Signed)
Attempted to contact patient no answer °

## 2015-12-18 NOTE — Telephone Encounter (Signed)
Brandon Herrera is calling about his Carvedilol . Not sure if he is to continue to taking the 1 and a half pill a day . Having some problems sleeping . Will like to speak to a nurse . Please call   Thanks

## 2015-12-19 NOTE — Telephone Encounter (Signed)
Spoke to patient. He inquired about an issue of sleep he's been having. After discussion, advised to continue his current meds -- he has issue w/ CPAP mask and hose needing replacement. He sent a request to [facility]. Pending an overdue payment, which he sent a money order for yesterday, they are going to send him new supplies. Pt aware to call here if they need Rx or other information.

## 2015-12-20 ENCOUNTER — Ambulatory Visit: Payer: PPO | Admitting: Cardiovascular Disease

## 2015-12-20 ENCOUNTER — Telehealth: Payer: Self-pay | Admitting: Cardiovascular Disease

## 2015-12-20 NOTE — Telephone Encounter (Signed)
New message    Patient calling stating someone called his neighbor - was told Mr. Ramamurthy has appt with Dr. Claiborne Billings this weeks.    Explain to patient previous appt on  4.26.2017 move up to  4.4.2017 .    Patient is asking for a nurse to call him back.

## 2015-12-20 NOTE — Telephone Encounter (Signed)
Returned call to pt. He thought his appt for today was cancelled when he was here seeing Dr Claiborne Billings in March and did he need to come to the appt today. Per Dr Evette Georges discharge, pt needs to be seen back in 6 months. Explained to pt that he did not need appt today. Pt wondering why his neighbor got the call. The number pt gave me was not the neighbor that is listed under his contacts.  Explained to pt I do not see where we called his neighbor.  Explained to pt he will get a letter in the mail or to call us back in a few months to schedule his 6 month appt.  Pt verbalized understanding.

## 2015-12-20 NOTE — Telephone Encounter (Signed)
New message     Patient calling stating someone called his neighbour

## 2015-12-23 ENCOUNTER — Telehealth: Payer: Self-pay | Admitting: Physician Assistant

## 2015-12-23 NOTE — Telephone Encounter (Signed)
Patient states, he take Neurontin and recently placed on Ambien to help with sleep. He he has been waking up 6 hours later feeling weak and fatigued without energy. He says his Ambien was recently increased to 10 mg before he started having the symptoms. I think he is on too much Ambien. And advised him to cut down on his dosage. His Coreg was recently increased.  I had advised him to seek medical attention if he has significant chest discomfort or persistent dizziness.  Brandon Corrigan PA Pager: [phone]

## 2015-12-25 DIAGNOSIS — G4733 Obstructive sleep apnea (adult) (pediatric): Secondary | ICD-10-CM | POA: Diagnosis not present

## 2015-12-29 ENCOUNTER — Telehealth: Payer: Self-pay | Admitting: Cardiovascular Disease

## 2015-12-29 NOTE — Telephone Encounter (Signed)
New MEsseage  Pt stated that Brandon Herrera in [name] sent a download of his Bipap earlier this week. Pt wanted to know if Dr Claiborne Billings received/reviewed. Please call back and discuss.

## 2015-12-29 NOTE — Telephone Encounter (Signed)
Routed to Wanda Waddell  

## 2016-01-03 NOTE — Telephone Encounter (Signed)
Follow up      Calling to see if Dr Claiborne Billings received the CPAP [facility]?  Pt states he is not sleeping and want the results.  Pt needs new supplies.  Pt called last week and has not received a call back.  Please call

## 2016-01-03 NOTE — Telephone Encounter (Signed)
Returned call to pt. He says he went to get new supplies from the [facility] to get new supplies.  He got new headgear, mask, and hose from a woman named Ailene Ravel there. Ailene Ravel also did a download last Tuesday, 12/26/15 per pt. Report not received yet from [facility]. Pt concerned that he is waking up in the night and not able to go back to sleep.  Asked pt if he wakes up to go to the bathroom, he said sometimes he does. He just does not know why he's waking up. He has recently been prescribed Ambien and says it does help him sleep but he is drowsy in the morning.  He has cut back on the amount he takes.  Reassured pt to continue with the new settings as adjusted by Dr Claiborne Billings at his last appointment. He seemed to be pleased and just wanted to make sure we did not need to do anything else with his machine at this time.  Will route to [employer].

## 2016-01-10 DIAGNOSIS — Z7984 Long term (current) use of oral hypoglycemic drugs: Secondary | ICD-10-CM | POA: Diagnosis not present

## 2016-01-10 DIAGNOSIS — I2510 Atherosclerotic heart disease of native coronary artery without angina pectoris: Secondary | ICD-10-CM | POA: Diagnosis not present

## 2016-01-10 DIAGNOSIS — I10 Essential (primary) hypertension: Secondary | ICD-10-CM | POA: Diagnosis not present

## 2016-01-10 DIAGNOSIS — E785 Hyperlipidemia, unspecified: Secondary | ICD-10-CM | POA: Diagnosis not present

## 2016-01-10 DIAGNOSIS — Z79899 Other long term (current) drug therapy: Secondary | ICD-10-CM | POA: Diagnosis not present

## 2016-01-10 DIAGNOSIS — Z791 Long term (current) use of non-steroidal anti-inflammatories (NSAID): Secondary | ICD-10-CM | POA: Diagnosis not present

## 2016-01-10 DIAGNOSIS — Z7902 Long term (current) use of antithrombotics/antiplatelets: Secondary | ICD-10-CM | POA: Diagnosis not present

## 2016-01-10 DIAGNOSIS — Z87891 Personal history of nicotine dependence: Secondary | ICD-10-CM | POA: Diagnosis not present

## 2016-01-10 DIAGNOSIS — G4700 Insomnia, unspecified: Secondary | ICD-10-CM | POA: Diagnosis not present

## 2016-01-10 DIAGNOSIS — G479 Sleep disorder, unspecified: Secondary | ICD-10-CM | POA: Diagnosis not present

## 2016-01-10 DIAGNOSIS — Z7982 Long term (current) use of aspirin: Secondary | ICD-10-CM | POA: Diagnosis not present

## 2016-01-10 DIAGNOSIS — H538 Other visual disturbances: Secondary | ICD-10-CM | POA: Diagnosis not present

## 2016-01-10 DIAGNOSIS — Z888 Allergy status to other drugs, medicaments and biological substances status: Secondary | ICD-10-CM | POA: Diagnosis not present

## 2016-01-10 DIAGNOSIS — R410 Disorientation, unspecified: Secondary | ICD-10-CM | POA: Diagnosis not present

## 2016-01-10 DIAGNOSIS — R42 Dizziness and giddiness: Secondary | ICD-10-CM | POA: Diagnosis not present

## 2016-01-10 DIAGNOSIS — I252 Old myocardial infarction: Secondary | ICD-10-CM | POA: Diagnosis not present

## 2016-01-10 DIAGNOSIS — Z8546 Personal history of malignant neoplasm of prostate: Secondary | ICD-10-CM | POA: Diagnosis not present

## 2016-01-10 DIAGNOSIS — E119 Type 2 diabetes mellitus without complications: Secondary | ICD-10-CM | POA: Diagnosis not present

## 2016-01-10 DIAGNOSIS — Z955 Presence of coronary angioplasty implant and graft: Secondary | ICD-10-CM | POA: Diagnosis not present

## 2016-01-10 DIAGNOSIS — Z87442 Personal history of urinary calculi: Secondary | ICD-10-CM | POA: Diagnosis not present

## 2016-01-12 ENCOUNTER — Telehealth: Payer: Self-pay | Admitting: Cardiovascular Disease

## 2016-01-12 NOTE — Telephone Encounter (Signed)
Pt want to know if he can cut back on the dose of his Carvedilol ,he feels real tired and can not sleep good.

## 2016-01-12 NOTE — Telephone Encounter (Signed)
No answer. Left message to call back.   

## 2016-01-12 NOTE — Telephone Encounter (Signed)
New message      The pt is concertain about not being able  to sleep, would like to have the MD to call in medication.   Pt wants to know what the problem is the is uncertain about what's really going on. But states he has not slept well at all.

## 2016-01-12 NOTE — Telephone Encounter (Signed)
Patient phoned in c/o being tired all the time and dizzy He stated he is unable to sleep at night and tired during the day  Blood pressure before morning Carvedilol usually in the 140's/70's and will come down to 110's after taking Stated he thought he felt better before the Carvedilol was increased to 12.5 mg 1 & 1/2 tablet twice a day Patient did get an Rx for Ambien from PCP but only took for about 5 days. He would only sleep for a few hours and then be groggy the next day Discussed with Dr Claiborne Billings and will decrease his Carvedilol to 12.5 mg twice a day to see if this will help Advised patient of recommendations and to monitor blood pressure and heart rate at home, verbalized understanding

## 2016-01-12 NOTE — Telephone Encounter (Signed)
Did advise patient to do download soon as requested for his sleep apnea

## 2016-01-14 ENCOUNTER — Telehealth: Payer: Self-pay | Admitting: Physician Assistant

## 2016-01-14 NOTE — Telephone Encounter (Signed)
Patient called on Sunday to report the same information he did on Friday.  Not sleeping well at nioght and very tired during the day.  Trying melatonin last few days which it seems to help a little.   He is supposed to get his CPAP downloaded tomorrow.    Tarri Fuller [facility]

## 2016-01-16 DIAGNOSIS — C61 Malignant neoplasm of prostate: Secondary | ICD-10-CM | POA: Diagnosis not present

## 2016-01-16 DIAGNOSIS — N200 Calculus of kidney: Secondary | ICD-10-CM | POA: Diagnosis not present

## 2016-01-18 ENCOUNTER — Telehealth: Payer: Self-pay | Admitting: Internal Medicine

## 2016-01-18 DIAGNOSIS — G4733 Obstructive sleep apnea (adult) (pediatric): Secondary | ICD-10-CM | POA: Diagnosis not present

## 2016-01-18 NOTE — Telephone Encounter (Signed)
On Call Cardiology   Patient called. BP has been running high despite being on a a number of BP meds.  Today BP 183/90 1 day ago -- 167/88  2 days ago - 138/68  3 days ago - 154/80  4 days ago 178/95  Plan:  1. Increase Amlodipine from 5 mg po qpm to 10 mg po qpm (he will start talking 2 tablest of 5 mg from tonight but will need a new prescription) 2. Continue Valsartan 160 mg poqd, coreg 12.5 mg po bid, Imdur 30 mg po qd, Maxzide half tab qd 3. Continue home BP monitoring 4. Follow up with Dr. Claiborne Billings 5. Call us back if any concerns or questions  Wandra Mannan, MD

## 2016-01-19 ENCOUNTER — Telehealth: Payer: Self-pay | Admitting: Cardiovascular Disease

## 2016-01-19 NOTE — Telephone Encounter (Signed)
Returned call to pt. He said Dr Susy Manor increased his amlodipine to 10mg  daily. He wanted to let us know about this change. No other complaints at this time.

## 2016-01-19 NOTE — Telephone Encounter (Signed)
New message   Patient calling     Pt C/O medication issue:  1. Name of Medication: amlodipine   2. How are you currently taking this medication (dosage and times per day)? Was 5 mg increase to  10 mg by Dr. Marshell Levan  3. Are you having a reaction (difficulty breathing--STAT)? No   4. What is your medication issue?Provider sated he would speak with Dr.Kelly regarding this medication change.

## 2016-01-21 NOTE — Telephone Encounter (Signed)
acknowleded

## 2016-01-24 DIAGNOSIS — E119 Type 2 diabetes mellitus without complications: Secondary | ICD-10-CM | POA: Diagnosis not present

## 2016-01-24 DIAGNOSIS — I10 Essential (primary) hypertension: Secondary | ICD-10-CM | POA: Diagnosis not present

## 2016-01-24 DIAGNOSIS — Z794 Long term (current) use of insulin: Secondary | ICD-10-CM | POA: Diagnosis not present

## 2016-01-24 DIAGNOSIS — E559 Vitamin D deficiency, unspecified: Secondary | ICD-10-CM | POA: Insufficient documentation

## 2016-01-24 DIAGNOSIS — I25810 Atherosclerosis of coronary artery bypass graft(s) without angina pectoris: Secondary | ICD-10-CM | POA: Diagnosis not present

## 2016-01-24 DIAGNOSIS — R5383 Other fatigue: Secondary | ICD-10-CM | POA: Diagnosis not present

## 2016-01-25 ENCOUNTER — Telehealth: Payer: Self-pay | Admitting: Cardiovascular Disease

## 2016-01-25 NOTE — Telephone Encounter (Signed)
New message      The pt called [facility] for the C-pap cushions but there was mix up on the pt supplies coming in the mail and the pt was concerned about the mix up the pt was wondering if the MD, could help, the pt states they down loaded the machine just having trouble getting his supplies for C-Pap machine. The pt can not sleep good at night with out the cushion.

## 2016-01-26 NOTE — Telephone Encounter (Signed)
Follow-up    The pt is calling back about the medications issues

## 2016-01-26 NOTE — Telephone Encounter (Signed)
Follow-up     The pt calling back to let us know he received the cushions for his Cpap   Pt c/o BP issue: STAT if pt c/o blurred vision, one-sided weakness or slurred speech  1. What are your last 5 BP readings? Yesterday 163/83 and this am 179/89 and again 6/2 @1115  b/p 167/78 p-84  2. Are you having any other symptoms (ex. Dizziness, headache, blurred vision, passed out)? no  3. What is your BP issue? Pt thinks it the medications the pt thinks it is carvedilol/the pt is concerned about the blood pressure   Pt c/o medication issue:  1. Name of Medication: gabpatentin  2. How are you currently taking this medication (dosage and times per day)? 100 mg po twice daily  3. Are you having a reaction (difficulty breathing--STAT)? no  4. What is your medication issue? Per pt stated Thinks it is making feel like he is going crazy was instructed a couple days to stop taking the by another Dr. [facility] on [address])   Pt c/o medication issue:  1. Name of Medication: Carvedilol  2. How are you currently taking this medication (dosage and times per day)? 12.5 mg po take 1 and 1/2 two times daily with meals  3. Are you having a reaction (difficulty breathing--STAT)? no  4. What is your medication issue? The pt states he was instructed to 1 pill daily but the pt states his b/p is up. Wants some suggestions   Pt c/o medication issue:  1. Name of Medication: Amlodipine  2. How are you currently taking this medication (dosage and times per day)? 5 mg po daily  3. Are you having a reaction (difficulty breathing--STAT)? no  4. What is your medication issue? The pt was instructed by another MD to take 2 pills daily(Dr. Ignacia Felling), the pt started taking the 2 pills around 5 days

## 2016-01-26 NOTE — Telephone Encounter (Signed)
Returned call. Pt had numerous questions regarding medications. He was advised to increase amlodipine by on-call cardiology. Pt still c/o high BP values, [id number] systolic. He has only been on increased dose for 4-5 days. Advised continue at current dose, check BPs at home over weekend. If still high can ask for further considerations from provider next week.  Pt wanted to be seen by Dr. Claiborne Billings. Offered 6/12 or 6/13 (new open bookings w Dr. Claiborne Billings) but patient then stated he needed to check his calendar.  He will call back and speak w scheduler to arrange appt.

## 2016-01-29 ENCOUNTER — Other Ambulatory Visit: Payer: Self-pay | Admitting: Cardiovascular Disease

## 2016-01-30 ENCOUNTER — Other Ambulatory Visit: Payer: Self-pay | Admitting: Cardiovascular Disease

## 2016-01-30 NOTE — Telephone Encounter (Signed)
Rx(s) sent to pharmacy electronically.  

## 2016-01-31 ENCOUNTER — Other Ambulatory Visit: Payer: Self-pay

## 2016-01-31 ENCOUNTER — Telehealth: Payer: Self-pay | Admitting: Cardiovascular Disease

## 2016-01-31 MED ORDER — AMLODIPINE BESYLATE 5 MG PO TABS: 10.0000 mg | ORAL_TABLET | Freq: Every day | ORAL | Status: DC

## 2016-01-31 MED ORDER — AMLODIPINE BESYLATE 5 MG PO TABS: 5.0000 mg | ORAL_TABLET | Freq: Every day | ORAL | Status: DC

## 2016-01-31 NOTE — Telephone Encounter (Signed)
Brandon Herrera is calling because the medication was increased to two tablets at night.Amlodipine Bystolic to 10mg  . Needs a new prescription sent [place] . Please call if you have any questions . Thanks

## 2016-01-31 NOTE — Telephone Encounter (Signed)
Rx(s) sent to pharmacy electronically.  

## 2016-01-31 NOTE — Telephone Encounter (Signed)
Rx(s) sent to pharmacy electronically.  Reference:  Wandra Mannan, MD at 01/18/2016 9:13 PM     Status: Signed       Expand All Collapse All   On Call Cardiology   Patient called. BP has been running high despite being on a a number of BP meds.  Today BP 183/90 1 day ago -- 167/88  2 days ago - 138/68  3 days ago - 154/80  4 days ago 178/95  Plan:  1. Increase Amlodipine from 5 mg po qpm to 10 mg po qpm (he will start talking 2 tablest of 5 mg from tonight but will need a new prescription) 2. Continue Valsartan 160 mg poqd, coreg 12.5 mg po bid, Imdur 30 mg po qd, Maxzide half tab qd 3. Continue home BP monitoring 4. Follow up with Dr. Claiborne Billings 5. Call us back if any concerns or questions  Wandra Mannan, MD

## 2016-02-11 ENCOUNTER — Telehealth: Payer: Self-pay | Admitting: Physician Assistant

## 2016-02-11 NOTE — Telephone Encounter (Signed)
Pt states has been concerned about being so sleepy during the day. He quit taking the Neurontin for several days, and felt better. He took a Neurontin last pm and has been very sleepy again today. Is the Neurontin responsible.  He is concerned because his amlodipine was increased from 5 mg up to 10 mg daily and he felt like that was too strong, so he went back to 5 mg qd.  This am, his SBP was > 160, but then dropped to 120s after taking his medication.   Advised him to take his amlodipine 5 mg 2 x day to try to smooth out his BP and minimize fluctuations. If that does not work for him, call back.   Pt concerned because he tried Ambien and Tylenol PM to sleep but felt groggy and sleepy all the next day. He is now taking melatonin and is doing better.  F/u with Dr Claiborne Billings.  [name] Aden 02/11/2016 3:14 PM Beeper [phone]

## 2016-02-13 DIAGNOSIS — G479 Sleep disorder, unspecified: Secondary | ICD-10-CM | POA: Diagnosis not present

## 2016-02-13 DIAGNOSIS — G629 Polyneuropathy, unspecified: Secondary | ICD-10-CM | POA: Diagnosis not present

## 2016-02-21 ENCOUNTER — Telehealth: Payer: Self-pay | Admitting: Cardiovascular Disease

## 2016-02-21 NOTE — Telephone Encounter (Signed)
Agree, take melatonin earlier this should shift his sleep to earlier; needs 7 - 8 hr sleep/night

## 2016-02-21 NOTE — Telephone Encounter (Signed)
Mr Character is calling because he is not feeling , not sure what is going on .Just knows that something is wrong. Please call  Thanks

## 2016-02-21 NOTE — Telephone Encounter (Signed)
Spoke w/ patient. He reports continued fatigue/daytime sleepiness. Note that we have made several recent tweaks to medications to help address this. He notes he has stopped taking ambien and is using 6mg  OTC melatonin nightly. Inquired about patient sleep patterns. He states he usually goes to bed around 10 - 10:30. He is reporting compliance w/ CPAP. I asked about his usual waking pattern - he reports he is usually up by 3am or 4am.  He reports that usually after lunch he is sleepy. Notes his blood sugar is high after lunch. We discussed that PPHG can be expected, but he may follow up w Dr. Avel Peace in [place] if concerns about this, as he is managing pt's diabetes.  I advised pt on taking his OTC melatonin 1 hr prior to bedtime (he currently takes 30 mins before bed),  and advised him to set an earlier bedtime - informed him that by his report, he is probably only getting ~5 hrs sleep a night - this is probably a main contributor to his fatigue. Advised OK to take a brief nap after lunch, but better goal is to increase his sleep duration at night.  Pt voiced agreement w this. We made a goal of moving bedtime to 1 hr earlier over the next week.  Will route to Dr. Claiborne Billings for further recommendations.

## 2016-02-29 ENCOUNTER — Telehealth: Payer: Self-pay | Admitting: Cardiovascular Disease

## 2016-02-29 DIAGNOSIS — N200 Calculus of kidney: Secondary | ICD-10-CM | POA: Diagnosis not present

## 2016-02-29 DIAGNOSIS — K8020 Calculus of gallbladder without cholecystitis without obstruction: Secondary | ICD-10-CM | POA: Diagnosis not present

## 2016-02-29 DIAGNOSIS — R1084 Generalized abdominal pain: Secondary | ICD-10-CM | POA: Diagnosis not present

## 2016-02-29 NOTE — Telephone Encounter (Signed)
Pt says is still waiting to hear something.

## 2016-02-29 NOTE — Telephone Encounter (Signed)
New message       Pt c/o swelling: STAT is pt has developed SOB within 24 hours  1. How long have you been experiencing swelling? 1week  2. Where is the swelling located? Ankles/legs.  Worse from knees down---worse in evenings 3.  Are you currently taking a "fluid pill"? Pt is not sure if he takes a fluid pill 4.  Are you currently SOB? no 5.  Have you traveled recently? no

## 2016-02-29 NOTE — Telephone Encounter (Signed)
Returned call to patient.He stated he has been having swelling in lower legs and ankles for the past 1 week.Stated he has been eating more salt in the afternoons.No sob.Stated he will stop eating salt and will call back if he continues to have swelling.

## 2016-03-13 DIAGNOSIS — E119 Type 2 diabetes mellitus without complications: Secondary | ICD-10-CM | POA: Diagnosis not present

## 2016-03-13 DIAGNOSIS — Z794 Long term (current) use of insulin: Secondary | ICD-10-CM | POA: Diagnosis not present

## 2016-03-18 ENCOUNTER — Telehealth: Payer: Self-pay | Admitting: Cardiovascular Disease

## 2016-03-18 NOTE — Telephone Encounter (Signed)
Spoke with patient. Informed him unsure who may have called him - he states he had a missed call from [phone]. Apologized for inconvenience. No further action needed.

## 2016-03-18 NOTE — Telephone Encounter (Signed)
Follow-up ° ° ° ° °The pt is returning the nurses call °

## 2016-03-19 ENCOUNTER — Telehealth: Payer: Self-pay | Admitting: Cardiovascular Disease

## 2016-03-19 NOTE — Telephone Encounter (Signed)
Spoke with patient about amlodipine On 6/18 - R. Barrett, PA recommended 5mg  BID instead of 10mg  QD of amlodipine  7/20 141/63 7/21 139/75 7/22 130/63 7/23 123/61 7/24 143/64 7/25 140/67  Patient states he feels his symptoms may be related to the heat.   He sees his PCP tomorrow - advised to discuss with PCP  He was concerned b/c his pill bottle states amlodipine 5mg  tablets - take 2 tablets (10mg ) daily.. Advised that splitting the dose into BID is the same amount of medication in a 24 hour period and he should take as directed by Suanne Marker, PA when he called in.   He also states his new PCP is Dr. Avel Peace - updated in EPIC  Routed to MD as Juluis Rainier per patient request.

## 2016-03-19 NOTE — Telephone Encounter (Signed)
New message   Pt not sure how to take amlodipine 5mg . Is he supposed to take one during the day and one at night? Please call.

## 2016-03-20 DIAGNOSIS — E6609 Other obesity due to excess calories: Secondary | ICD-10-CM | POA: Diagnosis not present

## 2016-03-20 DIAGNOSIS — R5383 Other fatigue: Secondary | ICD-10-CM | POA: Diagnosis not present

## 2016-03-20 DIAGNOSIS — I10 Essential (primary) hypertension: Secondary | ICD-10-CM | POA: Diagnosis not present

## 2016-03-20 DIAGNOSIS — E119 Type 2 diabetes mellitus without complications: Secondary | ICD-10-CM | POA: Diagnosis not present

## 2016-03-20 DIAGNOSIS — E559 Vitamin D deficiency, unspecified: Secondary | ICD-10-CM | POA: Diagnosis not present

## 2016-03-20 DIAGNOSIS — Z794 Long term (current) use of insulin: Secondary | ICD-10-CM | POA: Diagnosis not present

## 2016-03-20 DIAGNOSIS — Z1159 Encounter for screening for other viral diseases: Secondary | ICD-10-CM | POA: Diagnosis not present

## 2016-03-29 DIAGNOSIS — M79605 Pain in left leg: Secondary | ICD-10-CM | POA: Diagnosis not present

## 2016-03-29 DIAGNOSIS — R600 Localized edema: Secondary | ICD-10-CM | POA: Diagnosis not present

## 2016-03-29 DIAGNOSIS — M79604 Pain in right leg: Secondary | ICD-10-CM | POA: Diagnosis not present

## 2016-04-07 ENCOUNTER — Telehealth: Payer: Self-pay | Admitting: Nurse Practitioner

## 2016-04-07 NOTE — Telephone Encounter (Signed)
   Pt called saying that he's been feeling fatigued "for quite awhile."  He thinks that maybe b/c of his BP, which has been running 120's to 130's over 50's to 70's.  I advised that his BPs sounded pretty good and were unlikely to be causing him to feel that way.  He asked if he could try to take just one tab of coreg tonight instead of 1.5 tabs, and I advised that that should be fine.  He'll call back tomorrow to report if this made a difference for him.  Murray Hodgkins, NP 04/07/2016, 2:20 PM

## 2016-04-08 ENCOUNTER — Telehealth: Payer: Self-pay | Admitting: Cardiovascular Disease

## 2016-04-08 NOTE — Telephone Encounter (Signed)
Pt have been feeling tired and sleepy a lot. Talked to one of our doctor yesterday. He told him he could back on his Carvedilol. He now just take a pill 2 times a day. Please call to advise.

## 2016-04-08 NOTE — Telephone Encounter (Signed)
Follow up ° ° ° ° ° °Returning a call to the nurse °

## 2016-04-08 NOTE — Telephone Encounter (Signed)
Call rings w/ no answer. Goes to message alert stating patient is unavailable. No way to leave voice message.

## 2016-04-08 NOTE — Telephone Encounter (Signed)
Spoke with pt, he took 1 coreg last night and this morning and his bp is not going to tolerate the decrease. His bp today has been high 177/90. He is going back on the regular dose of carvedilol.

## 2016-04-16 ENCOUNTER — Telehealth: Payer: Self-pay | Admitting: Cardiovascular Disease

## 2016-04-16 NOTE — Telephone Encounter (Signed)
Goes to VM notification w/o method to leave voice mail.

## 2016-04-16 NOTE — Telephone Encounter (Signed)
°  New Prob   Pt c/o swelling: STAT is pt has developed SOB within 24 hours  1. How long have you been experiencing swelling? ~3 weeks  2. Where is the swelling located? Bilateral lower extremities; worse on L lower. Pt states he has also noted swelling in his abdomen.  3.  Are you currently taking a "fluid pill"? Yes  4.  Are you currently SOB? No  5.  Have you traveled recently? No

## 2016-04-17 NOTE — Telephone Encounter (Signed)
Spoke with pt, he has occ swelling in his feet and legs for the last 2-3 weeks. He was seen by his PCP and was told to call us. He reports swelling in his legs by the end of the day and his socks are leaving a deep mark on his legs. He also reports he feels bloated in his abdomen. He reports SOB with little exertion. He uses CPAP at night but has not been resting well. Pt scheduled to see the pa, meng, Friday this week. Pt agreed with this plan.

## 2016-04-19 ENCOUNTER — Encounter: Payer: Self-pay | Admitting: Physician Assistant

## 2016-04-19 ENCOUNTER — Ambulatory Visit (INDEPENDENT_AMBULATORY_CARE_PROVIDER_SITE_OTHER): Payer: PPO | Admitting: Physician Assistant

## 2016-04-19 VITALS — BP 122/70 | HR 80 | Ht 70.0 in | Wt 224.6 lb

## 2016-04-19 DIAGNOSIS — E785 Hyperlipidemia, unspecified: Secondary | ICD-10-CM

## 2016-04-19 DIAGNOSIS — Z9989 Dependence on other enabling machines and devices: Secondary | ICD-10-CM

## 2016-04-19 DIAGNOSIS — R0602 Shortness of breath: Secondary | ICD-10-CM | POA: Diagnosis not present

## 2016-04-19 DIAGNOSIS — R600 Localized edema: Secondary | ICD-10-CM

## 2016-04-19 DIAGNOSIS — Z79899 Other long term (current) drug therapy: Secondary | ICD-10-CM | POA: Diagnosis not present

## 2016-04-19 DIAGNOSIS — I10 Essential (primary) hypertension: Secondary | ICD-10-CM

## 2016-04-19 DIAGNOSIS — G4733 Obstructive sleep apnea (adult) (pediatric): Secondary | ICD-10-CM

## 2016-04-19 DIAGNOSIS — Z794 Long term (current) use of insulin: Secondary | ICD-10-CM

## 2016-04-19 DIAGNOSIS — I2510 Atherosclerotic heart disease of native coronary artery without angina pectoris: Secondary | ICD-10-CM

## 2016-04-19 DIAGNOSIS — E1142 Type 2 diabetes mellitus with diabetic polyneuropathy: Secondary | ICD-10-CM

## 2016-04-19 LAB — BASIC METABOLIC PANEL
BUN: 19 mg/dL (ref 7–25)
CHLORIDE: 104 mmol/L (ref 98–110)
CO2: 23 mmol/L (ref 20–31)
CREATININE: 1.08 mg/dL (ref 0.70–1.18)
Calcium: 10.4 mg/dL — ABNORMAL HIGH (ref 8.6–10.3)
Glucose, Bld: 101 mg/dL — ABNORMAL HIGH (ref 65–99)
POTASSIUM: 4.3 mmol/L (ref 3.5–5.3)
Sodium: 140 mmol/L (ref 135–146)

## 2016-04-19 MED ORDER — TRIAMTERENE-HCTZ 37.5-25 MG PO TABS: 1.0000 | ORAL_TABLET | Freq: Every day | ORAL | 6 refills | Status: DC

## 2016-04-19 NOTE — Progress Notes (Signed)
Cardiology Office Note    Date:  04/19/2016   ID:  Brandon Herrera, Brandon Herrera 06/25/1946, MRN [id number]  PCP:  Silvano Rusk, MD  Cardiologist:  Dr. Claiborne Billings  Chief Complaint  Patient presents with  . Follow-up    seen for Dr. Claiborne Billings    History of Present Illness:  Brandon Herrera is a 70 y.o. male with PMH of HTN, DM 2, HLD, OSA on CPAP, h/o prostate CA s/p seed implant, and CAD. He suffered an inferior MI in 1985 due to total RCA occlusion. He underwent PTCA of left circumflex in 1990 and directional atherectomy of eccentric calcified LAD stenosis in 1991. He is also status post intervention to left circumflex, LAD and first diagonal artery in 2005. His last echo in 2009 showed EF 35-45%. His sleep study in 2011 demonstrated mild sleep apnea overall with AHI 7.44 per hour, but sleep apnea was moderate at 16.4 per hour with REM sleep. He had O2 saturation dropped down to 88%. He has been placed on CPAP since 2011. His last Myoview obtaining 2013 showed EF 34%, overall low risk study, moderate sized inferior scar with new anterolateral wall ischemia toward apex noted. Recommend clinical correlation. He was last seen in the office in April 2017, at which time he complained of fatigue, but no angina. A download was obtained for his CPAP unit which showed 100% compliance, he did not have any leaking with his mask. His carvedilol was up titrated to 18.75 mg twice a day. His CPAP machine was titrated to 12 cm water pressure.   Looking the record, it appears patient has been calling our office multiple times a month since March for various reasons. Patient called after hour answering service in April complaining of feeling bad, his carvedilol was decreased back down to previous level. His gabapentin was stopped. He called back on 12/03/2015, stating that he had had episode chest pain and felt almost pass out, he was hit by a bull calf several week prior, he was recommended to go to the nearest ED. He was started on  Ambien, however would wake up 6 hours later feeling fatigued and tired. His amlodipine was increased to 10 mg daily in May.   He called cardiology office on 03/1221/2017 complaining of swelling in his feet and abdomen for the past 3-4 weeks. He presents today for further evaluation. He denies any symptom that reminiscent of his previous angina. He does have some shortness of breath. His last echocardiogram in 2009 showed EF 35-45%. Given the worsening edema recently, I have recommended him to have a repeat echocardiogram to see if we need to do any ischemic workup, however he says he lives in [place] and is quite far from here, eventually he agreed to have another echocardiogram on the next follow-up in about 1.5 month on the same day he visited Dr. Claiborne Billings. On physical exam today, he does appears to have 1-2+ pitting edema. I have instructed him to cut back on salt. He will also weigh himself every morning. We have discussed the need to put 2 pillows under his Lasix every night to help with edema. I will increase his triamterene/HCTZ to a full tablet from 1/2 tablet. I think this will be adequate enough to treat his current swelling. I will obtain CBC and basic metabolic panel today. We will also obtain a basic metabolic panel at [facility] in 2 weeks to monitor renal function and potassium after started on increased dose of potassium sparing diuretic. If  his ejection fraction does drop compared to his previous echocardiogram in 2009, I think it is reasonable to perform cardiac catheterization since he has not had another intervention since 2005.  We did not get EKG today as patient ID had a EKG on the last follow-up in April 2017, chronic T-wave inversion in the lateral leads noted.    Past Medical History:  Diagnosis Date  . Abnormal nuclear stress test 03/03/2012   mod size inferior scar w/new anterolateral wall ischemia towards apex  . CAD (coronary artery disease)   . Diabetes mellitus ([place])   .  Heart murmur    07/28/2008 ECHO: mild mitral annular ca+,AOV mildly sclerotic,mild LVH,mod.global hypokinesis,mild to mod post wall hypokinesis, EF 35-40%,LA mildly dilated  . Hyperlipidemia   . Hypertension   . Inferior MI ([place]) 1985   totalled RCA  . OSA on CPAP   . Prostate cancer ([place])    Seed implant    Past Surgical History:  Procedure Laterality Date  . CARDIAC CATHETERIZATION  02/11/2003   patent CX stent,chronically occluded RCA  . CARDIAC CATHETERIZATION  10/29/2004   No evidence of restenosis LAD but 30-40% narrowing prox. to stent,widely patient CX, old subtotalled RCA  . CORONARY ANGIOPLASTY WITH STENT PLACEMENT  1995   CX & LAD  . CORONARY ANGIOPLASTY WITH STENT PLACEMENT  12/25/2001   LCX  . CORONARY ANGIOPLASTY WITH STENT PLACEMENT  11/07/2003   CX, planned stenting of LAD later  . CORONARY ANGIOPLASTY WITH STENT PLACEMENT  12/05/2003   LAD  . PERCUTANEOUS CORONARY ROTOBLATOR INTERVENTION (PCI-R)  1991   LAD  . PTCA  1990   CX    Current Medications: Outpatient Medications Prior to Visit  Medication Sig Dispense Refill  . amLODipine (NORVASC) 5 MG tablet Take 2 tablets (10 mg total) by mouth daily. 60 tablet 9  . aspirin 81 MG tablet Take 81 mg by mouth daily.    Marland Kitchen atorvastatin (LIPITOR) 40 MG tablet Take 1 tablet (40 mg total) by mouth daily. 30 tablet 10  . Biotin 5000 MCG CAPS Take 1 capsule by mouth daily.    . clopidogrel (PLAVIX) 75 MG tablet Take 1 tablet (75 mg total) by mouth daily. 30 tablet 10  . glucose blood (ONE TOUCH ULTRA TEST) test strip Inject 1 strip as directed 2 (two) times daily.    . isosorbide mononitrate (IMDUR) 30 MG 24 hr tablet Take 1 tablet (30 mg total) by mouth daily. 30 tablet 10  . Multiple Vitamin (MULTIVITAMIN) tablet Take 1 tablet by mouth daily.    . valsartan (DIOVAN) 160 MG tablet Take 1 tablet (160 mg total) by mouth daily. 30 tablet 10  . vitamin B-12 (CYANOCOBALAMIN) 1000 MCG tablet Take 1,000 mcg by mouth daily.    Marland Kitchen  triamterene-hydrochlorothiazide (MAXZIDE-25) 37.5-25 MG tablet TAKE 1/2 tablet BY MOUTH DAILY 15 tablet 6  . carvedilol (COREG) 12.5 MG tablet Take 12.5 mg by mouth 2 (two) times daily with a meal.    . Fish Oil-Cholecalciferol (FISH OIL + D3) 1000-1000 MG-UNIT CAPS Take 1 capsule by mouth daily.    . fish oil-omega-3 fatty acids 1000 MG capsule Take 1 g by mouth daily. 1-2 daily    . gabapentin (NEURONTIN) 100 MG capsule Take 2 capsules by mouth daily.  5  . insulin NPH-regular Human (NOVOLIN 70/30) (70-30) 100 UNIT/ML injection Inject 45 Units into the skin 2 (two) times daily.    . metFORMIN (GLUCOPHAGE) 500 MG tablet Take by mouth. 500mg  in  the morning and 1000 mg at night     No facility-administered medications prior to visit.      Allergies:   Ace inhibitors and Zetia [ezetimibe]   Social History   Social History  . Marital status: Divorced    Spouse name: N/A  . Number of children: N/A  . Years of education: N/A   Social History Main Topics  . Smoking status: Former Research scientist (life sciences)  . Smokeless tobacco: Former Systems developer    Quit date: 08/26/1983  . Alcohol use No  . Drug use: No  . Sexual activity: Not Asked   Other Topics Concern  . None   Social History Narrative  . None     Family History:  The patient's family history includes Heart attack in his father and mother.   ROS:   Please see the history of present illness.    ROS All other systems reviewed and are negative.   PHYSICAL EXAM:   VS:  BP 122/70   Pulse 80   Ht 5\' 10"  (1.778 m)   Wt 224 lb 9.6 oz (101.9 kg)   BMI 32.23 kg/m    GEN: Well nourished, well developed, in no acute distress  HEENT: normal  Neck: no JVD, carotid bruits, or masses Cardiac: RRR; no murmurs, rubs, or gallops. 1-2+ pitting edema Respiratory:  clear to auscultation bilaterally, normal work of breathing GI: soft, nontender, nondistended, + BS MS: no deformity or atrophy  Skin: warm and dry, no rash Neuro:  Alert and Oriented x 3,  Strength and sensation are intact Psych: euthymic mood, full affect  Wt Readings from Last 3 Encounters:  04/19/16 224 lb 9.6 oz (101.9 kg)  11/28/15 211 lb 3.2 oz (95.8 kg)  04/11/15 216 lb 9.6 oz (98.2 kg)      Studies/Labs Reviewed:   EKG:  EKG is not ordered today.    Recent Labs: No results found for requested labs within last 8760 hours.   Lipid Panel    Component Value Date/Time   CHOL 157 04/11/2015 0803   TRIG 118 04/11/2015 0803   HDL 49 04/11/2015 0803   CHOLHDL 3.2 04/11/2015 0803   VLDL 24 04/11/2015 0803   LDLCALC 84 04/11/2015 0803    Additional studies/ records that were reviewed today include:    Echo 07/28/2008 EF 35-45%.  Myoview in 03/03/2012 EF 34%, overall low risk study, moderate sized inferior scar with new anterolateral wall ischemia toward apex noted. Recommend clinical correlation.   ASSESSMENT:    1. Lower leg edema   2. SOB (shortness of breath)   3. Polypharmacy   4. OSA on CPAP   5. Essential hypertension   6. Hyperlipidemia   7. Type 2 diabetes mellitus with diabetic polyneuropathy, with long-term current use of insulin ([place])   8. Coronary artery disease involving native coronary artery of native heart without angina pectoris      PLAN:  In order of problems listed above:  1. LE edema  - Ongoing for a month, typically note and night after standing all day, improve after overnight sleep in the morning. He does have 1-2+ pitting edema bilaterally. His abdomen is, however nontender, it was difficult to appreciate fluid in the abdomen.  - I recommended him to increase triamterene/HCTZ to a full dose instead of 1/2 tablet. I also recommended him to repeat echocardiogram since his previous Myoview in 2013, although low risk, did show some mild ischemia in the anterolateral wall. He denies any recent obvious angina symptom.  -  Unfortunately, he lives very far, we plan to obtain repeat echo on the same day he see Dr. Claiborne Billings in 1.5 month. I  have instructed him to seek urgent medical attention if he does have angina. If the echocardiogram does show his EF is lower, I would recommend a diagnostic cardiac catheterization.  2. CAD with ICM EF 35-45% -  inferior MI in 1985 due to total RCA occlusion. He underwent PTCA of left circumflex in 1990 and directional atherectomy of eccentric calcified LAD stenosis in 1991. He is also status post intervention to left circumflex, LAD and first diagonal artery in 2005 - last echo in 2009 showed EF 35-45% -  Myoview obtaining 2013 showed EF 34%, overall low risk study, moderate sized inferior scar with new anterolateral wall ischemia toward apex noted   3. HTN: Well controlled, he did bring his BP diary today, systolic blood pressure persisted in 120s to 130s range.  4. DM 2: Managed by PCP, on insulin at home and also metformin.  5. HLD: Continue 40 mg daily Lipitor.  6. OSA on CPAP: compliant.    Medication Adjustments/Labs and Tests Ordered: Current medicines are reviewed at length with the patient today.  Concerns regarding medicines are outlined above.  Medication changes, Labs and Tests ordered today are listed in the Patient Instructions below. Patient Instructions  Your physician recommends that you return for lab work TODAY and again in [place] (where you live).  Your physician has recommended you make the following change in your medication: your triamterene has been increased from 1/2 tablet to 1 tablet daily. A new prescription has been sent to your pharmacy to reflect this change.  Your physician has requested that you have an echocardiogram. Echocardiography is a painless test that uses sound waves to create images of your heart. It provides your doctor with information about the size and shape of your heart and how well your heart's chambers and valves are working. This procedure takes approximately one hour. There are no restrictions for this procedure. PER YOUR REQUEST THIS  WILL BE SCHEDULED ON October. This will either be scheduled on or after your 10:30 appointment with Dr Claiborne Billings.       Hilbert Corrigan, [place]  04/19/2016 10:43 AM    [address], [place], [place]  [address] Phone: [phone]; Fax: [phone]

## 2016-04-19 NOTE — Patient Instructions (Addendum)
Your physician recommends that you return for lab work TODAY and again in [place] (where you live).  Your physician has recommended you make the following change in your medication: your triamterene has been increased from 1/2 tablet to 1 tablet daily. A new prescription has been sent to your pharmacy to reflect this change.  Your physician has requested that you have an echocardiogram. Echocardiography is a painless test that uses sound waves to create images of your heart. It provides your doctor with information about the size and shape of your heart and how well your heart's chambers and valves are working. This procedure takes approximately one hour. There are no restrictions for this procedure. PER YOUR REQUEST THIS WILL BE SCHEDULED ON October. This will either be scheduled on or after your 10:30 appointment with Dr Claiborne Billings.

## 2016-05-02 DIAGNOSIS — R0602 Shortness of breath: Secondary | ICD-10-CM | POA: Diagnosis not present

## 2016-05-02 DIAGNOSIS — Z79899 Other long term (current) drug therapy: Secondary | ICD-10-CM | POA: Diagnosis not present

## 2016-05-02 DIAGNOSIS — R600 Localized edema: Secondary | ICD-10-CM | POA: Diagnosis not present

## 2016-05-02 LAB — CBC
HCT: 39.4 % (ref 38.5–50.0)
HEMOGLOBIN: 13.1 g/dL — AB (ref 13.2–17.1)
MCH: 31 pg (ref 27.0–33.0)
MCHC: 33.2 g/dL (ref 32.0–36.0)
MCV: 93.4 fL (ref 80.0–100.0)
MPV: 9.7 fL (ref 7.5–12.5)
PLATELETS: 195 10*3/uL (ref 140–400)
RBC: 4.22 MIL/uL (ref 4.20–5.80)
RDW: 13.6 % (ref 11.0–15.0)
WBC: 7.3 10*3/uL (ref 3.8–10.8)

## 2016-05-03 LAB — BASIC METABOLIC PANEL
BUN: 19 mg/dL (ref 7–25)
CO2: 21 mmol/L (ref 20–31)
Calcium: 10 mg/dL (ref 8.6–10.3)
Chloride: 104 mmol/L (ref 98–110)
Creat: 1.17 mg/dL (ref 0.70–1.18)
GLUCOSE: 209 mg/dL — AB (ref 65–99)
Potassium: 4.5 mmol/L (ref 3.5–5.3)
SODIUM: 139 mmol/L (ref 135–146)

## 2016-05-23 DIAGNOSIS — G4733 Obstructive sleep apnea (adult) (pediatric): Secondary | ICD-10-CM | POA: Diagnosis not present

## 2016-06-03 ENCOUNTER — Telehealth: Payer: Self-pay | Admitting: Cardiovascular Disease

## 2016-06-03 NOTE — Telephone Encounter (Deleted)
error 

## 2016-06-04 ENCOUNTER — Ambulatory Visit (HOSPITAL_COMMUNITY): Payer: PPO | Attending: Cardiovascular Disease

## 2016-06-04 ENCOUNTER — Other Ambulatory Visit: Payer: Self-pay | Admitting: Cardiovascular Disease

## 2016-06-04 ENCOUNTER — Ambulatory Visit (INDEPENDENT_AMBULATORY_CARE_PROVIDER_SITE_OTHER): Payer: PPO | Admitting: Cardiovascular Disease

## 2016-06-04 ENCOUNTER — Encounter: Payer: Self-pay | Admitting: Cardiovascular Disease

## 2016-06-04 ENCOUNTER — Encounter (HOSPITAL_COMMUNITY): Payer: Self-pay | Admitting: Radiology

## 2016-06-04 ENCOUNTER — Other Ambulatory Visit: Payer: Self-pay

## 2016-06-04 VITALS — BP 132/70 | HR 83 | Ht 70.0 in | Wt 226.4 lb

## 2016-06-04 DIAGNOSIS — Z794 Long term (current) use of insulin: Secondary | ICD-10-CM

## 2016-06-04 DIAGNOSIS — G4733 Obstructive sleep apnea (adult) (pediatric): Secondary | ICD-10-CM

## 2016-06-04 DIAGNOSIS — R600 Localized edema: Secondary | ICD-10-CM | POA: Insufficient documentation

## 2016-06-04 DIAGNOSIS — E1142 Type 2 diabetes mellitus with diabetic polyneuropathy: Secondary | ICD-10-CM

## 2016-06-04 DIAGNOSIS — I2510 Atherosclerotic heart disease of native coronary artery without angina pectoris: Secondary | ICD-10-CM | POA: Diagnosis not present

## 2016-06-04 DIAGNOSIS — E785 Hyperlipidemia, unspecified: Secondary | ICD-10-CM

## 2016-06-04 DIAGNOSIS — I5030 Unspecified diastolic (congestive) heart failure: Secondary | ICD-10-CM | POA: Insufficient documentation

## 2016-06-04 DIAGNOSIS — R0602 Shortness of breath: Secondary | ICD-10-CM | POA: Insufficient documentation

## 2016-06-04 NOTE — CV Procedure (Signed)
Patient arrived 30 minutes late for echocardiogram.

## 2016-06-04 NOTE — Patient Instructions (Signed)
Your physician wants you to follow-up in: 1 year or sooner if needed. You will receive a reminder letter in the mail two months in advance. If you don't receive a letter, please call our office to schedule the follow-up appointment.   If you need a refill on your cardiac medications before your next appointment, please call your pharmacy.   

## 2016-06-05 NOTE — Progress Notes (Signed)
Patient ID: Brandon Herrera, male   DOB: Jul 20, 1946, 70 y.o.   MRN: [id number]    HPI: Brandon Herrera is a 70 y.o. male who presents to the office today for a 6 month cardiology followup evaluation.  Brandon Herrera has CAD and suffered in an inferior myocardial infarction in 1985 due to  total RCA occlusion. In 1990 he underwent PTCA of the circumflex and in 1991 directional coronary atherectomy of an eccentric ossified LAD stenosis. He is also status post interventions to circumflex coronary artery with his last intervention in 2005 at which time he also had a intervention to the LAD and first diagonal vessel. He has a history of type 2 diabetes mellitus, hypertension, and hyperlipidemia.  He also has remote history of prostate CA and is status post prostate seed implantation  In 2011 a sleep study demonstrated mild sleep apnea overall with an AHI of 7.44/hr but sleep apnea was moderate at 16.4/hr with REM sleep. He  dropped his oxygen saturation to 88%. He has been using CPAP therapy since 2011 and notes huge difference is in his sense of well-being.  A download from March 2013 through June 2013 showed 100% usage, averaging 7 hours and 29 minutes per night. At that time, his AHI was 3.5 per hour. He presently denies any breakthrough snoring. He denies residual daytime sleepiness. He denies restless legs.  Over the past year, he has remained fairly stable from a cardiac standpoint. Specifically he denies recurrent anginal symptoms. He believes his blood pressure has been controlled.  He has been active.  He admits to compliance with his medical regimen.  Recently, however, he has noticed more difficulty in having prolonged sleep.  He continues to have significant social issues with his brother.  5 weeks ago, he was hit in the head by a bull calf and sustained some mild head trauma.  He has noticed some floaters in his right eye.  Due to his fatigability, a download was obtained from his CPAP unit.  This revealed  excellent compliance with 100% of days used with him, averaging 8 hours and 46 minutes of sleep per night.  On 10 cm set pressure, AHI was 5.5, mainly due to and hypopnea index of 4.0, with an apnea index of 1.5.  He did not have any leak with his mask.   Since I last saw him, he has continued to do well.  He denies any episodes of recurrent chest pain.  He denies significant dyspnea.  He continues to use CPAP with 100% compliance.  His fatigability has improved.  I have reviewed his download from [facility] 2117 through May 20 117.  He was meeting compliance standards.  He is set at a 12 cm water pressure with an HI 5.0.  He is averaging almost 8 hours of sleep per night.  He just completed an echo Doppler study today prior to his office visit.  He presents for reevaluation.   Past Medical History:  Diagnosis Date  . Abnormal nuclear stress test 03/03/2012   mod size inferior scar w/new anterolateral wall ischemia towards apex  . CAD (coronary artery disease)   . Diabetes mellitus ([place])   . Heart murmur    07/28/2008 ECHO: mild mitral annular ca+,AOV mildly sclerotic,mild LVH,mod.global hypokinesis,mild to mod post wall hypokinesis, EF 35-40%,LA mildly dilated  . Hyperlipidemia   . Hypertension   . Inferior MI ([place]) 1985   totalled RCA  . OSA on CPAP   . Prostate cancer ([place])  Seed implant    Past Surgical History:  Procedure Laterality Date  . CARDIAC CATHETERIZATION  02/11/2003   patent CX stent,chronically occluded RCA  . CARDIAC CATHETERIZATION  10/29/2004   No evidence of restenosis LAD but 30-40% narrowing prox. to stent,widely patient CX, old subtotalled RCA  . CORONARY ANGIOPLASTY WITH STENT PLACEMENT  1995   CX & LAD  . CORONARY ANGIOPLASTY WITH STENT PLACEMENT  12/25/2001   LCX  . CORONARY ANGIOPLASTY WITH STENT PLACEMENT  11/07/2003   CX, planned stenting of LAD later  . CORONARY ANGIOPLASTY WITH STENT PLACEMENT  12/05/2003   LAD  . PERCUTANEOUS CORONARY ROTOBLATOR INTERVENTION  (PCI-R)  1991   LAD  . PTCA  1990   CX    Allergies  Allergen Reactions  . Ace Inhibitors Cough    Cough with Benazepril   . Zetia [Ezetimibe] Other (See Comments)    myalgias    Current Outpatient Prescriptions  Medication Sig Dispense Refill  . acetaminophen (TYLENOL) 650 MG CR tablet Take one and a half (1 1/2) tablet (975 mg total) by mouth each morning. Take one (1) tablet (650 mg total) by mouth with lunch and supper and at bedtime.    Marland Kitchen amLODipine (NORVASC) 5 MG tablet Take 2 tablets (10 mg total) by mouth daily. 60 tablet 9  . aspirin 81 MG tablet Take 81 mg by mouth daily.    Marland Kitchen atorvastatin (LIPITOR) 40 MG tablet Take 1 tablet (40 mg total) by mouth daily. 30 tablet 10  . Biotin 5000 MCG CAPS Take 1 capsule by mouth daily.    . carvedilol (COREG) 12.5 MG tablet Take 18.75 mg by mouth 2 (two) times daily.    . celecoxib (CELEBREX) 200 MG capsule Take 200 mg by mouth daily.    . Cetirizine HCl 10 MG CAPS Take 1 capsule by mouth daily.    . Cholecalciferol (VITAMIN D3) 5000 units TABS Take 5,000 Units by mouth daily.    . clopidogrel (PLAVIX) 75 MG tablet Take 1 tablet (75 mg total) by mouth daily. 30 tablet 10  . gabapentin (NEURONTIN) 100 MG capsule Take 2-3 capsules by mouth daily.    Marland Kitchen glucose blood (ONE TOUCH ULTRA TEST) test strip Inject 1 strip as directed 2 (two) times daily.    . insulin NPH-regular Human (NOVOLIN 70/30) (70-30) 100 UNIT/ML injection Inject 50 Units into the skin 2 (two) times daily.    . isosorbide mononitrate (IMDUR) 30 MG 24 hr tablet Take 1 tablet (30 mg total) by mouth daily. 30 tablet 10  . Melatonin 5 MG CAPS Take 5 mg by mouth at bedtime.    . metFORMIN (GLUCOPHAGE) 500 MG tablet Take 1,000 mg by mouth 2 (two) times daily.    . Multiple Vitamin (MULTIVITAMIN) tablet Take 1 tablet by mouth daily.    . Omega-3 Fatty Acids (KP FISH OIL) 1200 MG CAPS Take two (2) capsules by mouth each morning and one (1) capsule by mouth each evening.    .  triamterene-hydrochlorothiazide (MAXZIDE-25) 37.5-25 MG tablet Take 1 tablet by mouth daily. 30 tablet 6  . valsartan (DIOVAN) 160 MG tablet Take 1 tablet (160 mg total) by mouth daily. 30 tablet 10  . vitamin B-12 (CYANOCOBALAMIN) 1000 MCG tablet Take 1,000 mcg by mouth daily.     No current facility-administered medications for this visit.     SOCHX is notable that he is divorced. He does not have children he did have family issues in the past particularly with  his brother. This seems to be improving. There is no tobacco or alcohol use.  ROS General: Negative; No fevers, chills, or night sweats;  HEENT: Negative; No changes in vision or hearing, sinus congestion, difficulty swallowing Pulmonary: Negative; No cough, wheezing, shortness of breath, hemoptysis Cardiovascular: See history of present illness No chest pain, presyncope, syncope, palpitations GI: Negative; No nausea, vomiting, diarrhea, or abdominal pain GU: Negative; No dysuria, hematuria, or difficulty voiding Musculoskeletal: Negative; no myalgias, joint pain, or weakness Hematologic/Oncology: Negative; no easy bruising, bleeding Endocrine: Negative; no heat/cold intolerance; no diabetes Neuro: Negative; no changes in balance, headaches Skin: Negative; No rashes or skin lesions Psychiatric: Negative; No behavioral problems, depression Sleep: Positive for obstructive sleep apnea on CPAP therapy with 100% compliance; No snoring, daytime sleepiness, hypersomnolence, bruxism, restless legs, hypnogognic hallucinations, no cataplexy Other comprehensive 14 point system review is negative.  PE BP 132/70 (BP Location: Left Arm, Patient Position: Sitting, Cuff Size: Normal)   Pulse 83   Ht _0  (1.778 m)   Wt 226 lb 6.4 oz (102.7 kg)   BMI 32.49 kg/m    Repeat blood pressure by me 166/80.  Wt Readings from Last 3 Encounters:  06/04/16 226 lb 6.4 oz (102.7 kg)  04/19/16 224 lb 9.6 oz (101.9 kg)  11/28/15 211 lb 3.2 oz (95.8  kg)   General: Alert, oriented, no distress.  HEENT: Normocephalic.  Mild bruising over the left forehead from his recent injury from a bull calf.  Pupils round and reactive; sclera anicteric; Fundi mild arteriolar narrowing. No xanthelasmas Nose without nasal septal hypertrophy Mouth/Parynx benign; Mallinpatti scale 3 Neck: No JVD, no carotid bruits with normal carotid upstroke Chest wall: Nontender to palpation Lungs: clear to ausculatation and percussion; no wheezing or rales Heart: RRR, s1 s2 normal 1/6 systolic murmur, no S3 or S4 gallop; no rubs thrills or heaves Abdomen: soft, nontender; no hepatosplenomehaly, BS+; abdominal aorta nontender and not dilated by palpation. Back: No CVA tenderness Pulses 2+ Extremities: no clubbing cyanosis or edema, Homan's sign negative  Neurologic: grossly nonfocal, cranial nerves grossly normal. Psychological: Normal affect and mood   ECG (independently read by me): Normal sinus rhythm with mild sinus arrhythmia at 85 bpm.  Q waves in leads III and F.  Previously noted T-wave abnormality inferolaterally.  ECG (independently interpreted by me): Normal sinus rhythm at 66 bpm.  Early transition.  Nonspecific T-wave changes V4 through V6 and in leads 2 and aVF.  ECG (independently interpreted by me): Normal sinus rhythm. Nonspecific T changes. Normal intervals.   LABS:  BMP Latest Ref Rng & Units 05/02/2016 04/19/2016 04/11/2015  Glucose 65 - 99 mg/dL 209(H) 101(H) 144(H)  BUN 7 - 25 mg/dL _1 Creatinine 0.70 - 1.18 mg/dL 1.17 1.08 0.84  Sodium 135 - 146 mmol/L 139 140 140  Potassium 3.5 - 5.3 mmol/L 4.5 4.3 4.3  Chloride 98 - 110 mmol/L 104 104 107  CO2 20 - 31 mmol/L _2 Calcium 8.6 - 10.3 mg/dL 10.0 10.4(H) 9.7   Hepatic Function Latest Ref Rng & Units 04/11/2015  Total Protein 6.1 - 8.1 g/dL 6.3  Albumin 3.6 - 5.1 g/dL 4.1  AST 10 - 35 U/L 27  ALT 9 - 46 U/L 34  Alk Phosphatase 40 - 115 U/L 59  Total Bilirubin 0.2 - 1.2 mg/dL  0.5   CBC Latest Ref Rng & Units 05/02/2016 04/11/2015  WBC 3.8 - 10.8 K/uL 7.3 7.4  Hemoglobin 13.2 - 17.1 g/dL 13.1(L)  14.8  Hematocrit 38.5 - 50.0 % 39.4 43.3  Platelets 140 - 400 K/uL 195 179   Lab Results  Component Value Date   MCV 93.4 05/02/2016   MCV 91.2 04/11/2015   Lab Results  Component Value Date   TSH 1.497 04/11/2015   Lab Results  Component Value Date   HGBA1C 7.2 (H) 04/11/2015   Lipid Panel     Component Value Date/Time   CHOL 157 04/11/2015 0803   TRIG 118 04/11/2015 0803   HDL 49 04/11/2015 0803   CHOLHDL 3.2 04/11/2015 0803   VLDL 24 04/11/2015 0803   LDLCALC 84 04/11/2015 0803    RADIOLOGY: No results found.    ASSESSMENT AND PLAN: Brandon Herrera is a 70 year old Caucasian male who has established coronary artery disease documented for 32  years when he presented with his inferior wall myocardial infarction in 1985 was found to have total RCA occlusion. He also is status post remote PTCA of the circumflex in 1990, and in 1991 underwent [place] of an eccentrically calcified LAD stenosis. His last intervention in 2005 was done to his LAD and first diagonal vessel which was stented. He has continued to be stable without recurrent anginal symptomatology. His blood pressure today is normal at 118/70 on  amlodipine 5 mg, carvedilol 18.75 mg twice a day, isosorbide 30 mg, valsartan 160 mg, and one half of a Maxide pill.  His ventricular rate is 83 and he has previously noted T-wave abnormalities.  He has tolerated the increased dose of carvedilol which was increased at his last office visit.  When I last saw him, I further titrated his CPAP to 12 cm water pressure.  His fatigability has improved.  I reviewed his download with him in detail.  His most recent download from 11/14/2015 through 01/14/2016 shows 100%.  A's of usage.  98% of days usage greater than 4 hours.  He is averaging 7 hours and 55 minutes of sleep at 12 cm water pressure.  AHI is 5.0. Time spent: 25  minutes. He is now seeing Dr. Silvano Rusk at [facility].  I reviewed blood work from September 2017.  Repeat blood work will be done by his primary physician.  He is diabetic on insulin and metformin.  Glucose 1 month ago was elevated at 209.  I reviewed a recent echo Doppler study from earlier today.  EF was 45-50%.  There was mitral annular calcification.  There is mild LA dilatation.  He will continue his current medical therapy.  As long as he remains stable, I will see him in one year for reevaluation.  Troy Sine, MD, [facility]  06/05/2016 8:09 AM

## 2016-06-10 ENCOUNTER — Other Ambulatory Visit: Payer: Self-pay | Admitting: *Deleted

## 2016-06-10 ENCOUNTER — Other Ambulatory Visit: Payer: Self-pay | Admitting: Cardiovascular Disease

## 2016-06-10 DIAGNOSIS — N200 Calculus of kidney: Secondary | ICD-10-CM | POA: Diagnosis not present

## 2016-06-10 DIAGNOSIS — C61 Malignant neoplasm of prostate: Secondary | ICD-10-CM | POA: Diagnosis not present

## 2016-06-10 MED ORDER — CARVEDILOL 12.5 MG PO TABS: 18.7500 mg | ORAL_TABLET | Freq: Two times a day (BID) | ORAL | 11 refills | Status: DC

## 2016-06-10 MED ORDER — CARVEDILOL 12.5 MG PO TABS: 18.7500 mg | ORAL_TABLET | Freq: Two times a day (BID) | ORAL | 3 refills | Status: DC

## 2016-06-10 NOTE — Telephone Encounter (Signed)
New message      *STAT* If patient is at the pharmacy, call can be transferred to refill team.   1. Which medications need to be refilled? (please list name of each medication and dose if known) carvedilol (COREG) 12.5 MG tablet  2. Which pharmacy/location (including street and city if local pharmacy) is medication to be sent to? [facility]    3. Do they need a 30 day or 90 day supply? 90 days supply

## 2016-06-18 DIAGNOSIS — Z794 Long term (current) use of insulin: Secondary | ICD-10-CM | POA: Diagnosis not present

## 2016-06-18 DIAGNOSIS — Z1159 Encounter for screening for other viral diseases: Secondary | ICD-10-CM | POA: Diagnosis not present

## 2016-06-18 DIAGNOSIS — E119 Type 2 diabetes mellitus without complications: Secondary | ICD-10-CM | POA: Diagnosis not present

## 2016-06-20 DIAGNOSIS — Z794 Long term (current) use of insulin: Secondary | ICD-10-CM | POA: Diagnosis not present

## 2016-06-20 DIAGNOSIS — E785 Hyperlipidemia, unspecified: Secondary | ICD-10-CM | POA: Diagnosis not present

## 2016-06-20 DIAGNOSIS — I10 Essential (primary) hypertension: Secondary | ICD-10-CM | POA: Diagnosis not present

## 2016-06-20 DIAGNOSIS — E119 Type 2 diabetes mellitus without complications: Secondary | ICD-10-CM | POA: Diagnosis not present

## 2016-06-20 DIAGNOSIS — R609 Edema, unspecified: Secondary | ICD-10-CM | POA: Diagnosis not present

## 2016-07-05 NOTE — Telephone Encounter (Signed)
error 

## 2016-07-30 ENCOUNTER — Telehealth: Payer: Self-pay | Admitting: Cardiovascular Disease

## 2016-07-30 NOTE — Telephone Encounter (Signed)
Unable to reach patient at home when dialed.

## 2016-07-30 NOTE — Telephone Encounter (Signed)
message send to triage

## 2016-07-30 NOTE — Telephone Encounter (Signed)
New message  Pt is calling/having leg issues for about few years  Wants to know if some of it is due to taking Lipitor 40mg   Wants to know if he should cut back  Please call and advise

## 2016-07-31 NOTE — Telephone Encounter (Signed)
Tried to call pt, no VM 

## 2016-07-31 NOTE — Telephone Encounter (Signed)
Tried to call pt phone just keeps ringing. 

## 2016-07-31 NOTE — Telephone Encounter (Signed)
Spoke with pt he states that that he has had muscle weakness and cramping for awhile now.  He also states that he is unsteady with his gait since his last visit and has LE swelling. He has discussed his abdominal swelling with his PCP and he told him to decrease his salt intake, he states that this has not helped. Pt was wondering if we can decrease Lipitor or change medication? Please advise

## 2016-07-31 NOTE — Telephone Encounter (Signed)
Pt notified he will stop tonight. And have lab work in march.

## 2016-07-31 NOTE — Telephone Encounter (Signed)
Have patient hold atorvastatin for 2 weeks.  If symptoms relieved, then restart atorvastatin at 20 mg daily.  Would suggest repeat lipid lab about 2 months after restarting

## 2016-08-14 ENCOUNTER — Telehealth: Payer: Self-pay | Admitting: Cardiovascular Disease

## 2016-08-14 DIAGNOSIS — E785 Hyperlipidemia, unspecified: Secondary | ICD-10-CM

## 2016-08-14 NOTE — Telephone Encounter (Signed)
Pt said he was off Atorvastatin for 2 weeks as he was told.He says he is doing better since he stopped taking it. He wants to know what to do next?

## 2016-08-14 NOTE — Telephone Encounter (Signed)
Unable to reach pt or leave a message mailbox is not set up. 

## 2016-08-15 NOTE — Telephone Encounter (Signed)
Have patient hold atorvastatin for 2 weeks.  If symptoms relieved, then restart atorvastatin at 20 mg daily.  Would suggest repeat lipid lab about 2 months after restarting  Pt notified he will come in in Feb for fasting lipids

## 2016-08-15 NOTE — Telephone Encounter (Signed)
Duplicate, see below  Pt is calling back  Please return his call in reference to Atorvastatin

## 2016-08-16 NOTE — Telephone Encounter (Signed)
reviewed

## 2016-08-21 DIAGNOSIS — E119 Type 2 diabetes mellitus without complications: Secondary | ICD-10-CM | POA: Diagnosis not present

## 2016-08-21 DIAGNOSIS — H2513 Age-related nuclear cataract, bilateral: Secondary | ICD-10-CM | POA: Diagnosis not present

## 2016-08-21 DIAGNOSIS — Z794 Long term (current) use of insulin: Secondary | ICD-10-CM | POA: Diagnosis not present

## 2016-08-21 DIAGNOSIS — D3132 Benign neoplasm of left choroid: Secondary | ICD-10-CM | POA: Diagnosis not present

## 2016-08-30 ENCOUNTER — Telehealth: Payer: Self-pay | Admitting: Cardiovascular Disease

## 2016-08-30 NOTE — Telephone Encounter (Signed)
No answer when dialed. Goes to alert message stating no VM box set up.

## 2016-08-30 NOTE — Telephone Encounter (Signed)
New message  Pt call requesting to speak with RN. Pt states he is experiencing some numbness in his right arm. Pt states it states about a week ago.pt states it has gotten worse within the last 2 to 3 nights. Pt states his right arm and hand are tickling. Please call back to discuss

## 2016-09-03 NOTE — Telephone Encounter (Signed)
Unable to reach patient or leave msg requesting call back.

## 2016-09-10 DIAGNOSIS — C61 Malignant neoplasm of prostate: Secondary | ICD-10-CM | POA: Diagnosis not present

## 2016-10-03 DIAGNOSIS — G4733 Obstructive sleep apnea (adult) (pediatric): Secondary | ICD-10-CM | POA: Diagnosis not present

## 2016-10-08 DIAGNOSIS — E785 Hyperlipidemia, unspecified: Secondary | ICD-10-CM | POA: Diagnosis not present

## 2016-10-08 LAB — LIPID PANEL
Cholesterol: 147 mg/dL (ref <200)
HDL: 52 mg/dL (ref >40)
LDL CALC: 77 mg/dL (ref <100)
Total CHOL/HDL Ratio: 2.8 Ratio (ref <5.0)
Triglycerides: 92 mg/dL (ref <150)
VLDL: 18 mg/dL (ref <30)

## 2016-10-09 DIAGNOSIS — G4733 Obstructive sleep apnea (adult) (pediatric): Secondary | ICD-10-CM | POA: Diagnosis not present

## 2016-10-10 ENCOUNTER — Telehealth: Payer: Self-pay | Admitting: Cardiovascular Disease
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
## 2016-10-14 ENCOUNTER — Encounter: Payer: Self-pay | Admitting: *Deleted

## 2016-11-10 ENCOUNTER — Telehealth: Payer: Self-pay | Admitting: Physician Assistant

## 2016-11-10 NOTE — Telephone Encounter (Signed)
Pt called because he has been feeling bad during the days.  He will get up, ck CBG and BP. He takes BP rx and oral meds.   He will drive to the restaurant then give himself the insulin and go inside to eat.   Later in the morning he will get very sleepy and have trouble staying awake.   He takes the Neurontin at bedtime. He tried a lower dose but it did not work.  200 mg qhs helps him.  He has OSA and is on CPAP. He wonders if he needs a new cushion.  No new OTC meds.   Advised I will ask the Pharmacy team to contact him and bring him in for a medication review next week.  Then f/u with Dr Claiborne Billings.  [name] Aden 11/10/2016 2:46 PM Beeper [phone]

## 2016-11-11 ENCOUNTER — Telehealth: Payer: Self-pay | Admitting: Cardiovascular Disease

## 2016-11-11 NOTE — Telephone Encounter (Signed)
PT  AWARE   HAVE  RECEIVED  MESSAGE  FROM YESTERDAY  AND  DISCUSSED  WITH  [facility]  D  AND  SHE  WILL  CALL PT   TO REVIEW MEDS   the patient  THINKS   SYMPTOMS ARE  COMING  FROM  CPAP EQUIPMENT  ALSO NOTES  MESSAGE  WAS FORWARDED TO DR Claiborne Billings .Adonis Housekeeper  SEE OTHER  PHONE NOTE

## 2016-11-11 NOTE — Telephone Encounter (Signed)
New message    Pt is calling stating he called yesterday. He says he has been having trouble staying awake during the day and he thinks it has to do with is CPAP machine.

## 2016-11-11 NOTE — Telephone Encounter (Signed)
Recommendations:  1. STOP taking melatonin  2. TAKE cetirizine ,amlodipine, valsartan and gabapentin at BEDTIME  3. CONTINUE all other medication as prescribed  4. Monitor BP mid-day after taking medication to re-assess.   **Will call patient in 3 days to re-assess level of sedation**  **Dr Claiborne Billings to assess if need to adjust CPAP**

## 2016-11-12 ENCOUNTER — Telehealth: Payer: Self-pay | Admitting: Cardiovascular Disease

## 2016-11-12 NOTE — Telephone Encounter (Signed)
Returned call to patient.He stated he he has been feeling bad,no energy.Stated his B/P has been 150 to 170 /70 when he gets up in mornings.After he takes medications drops to 119/67 pulse 80.Stated he spoke to PA on call this past Sunday and she advised he might try decreasing coreg to 12.5 mg twice a day.He is currently taking coreg 12.5 mg 1&1/2 tablet twice a day.Patient was offered appointment with PA tomorrow but he refused wants to decrease Coreg to 12.5 mg twice a day first to see if he feels better.Stated he is under a lot of stress.Advised to monitor B/P and call back if he continues to feel bad.Message sent to [facility].

## 2016-11-12 NOTE — Telephone Encounter (Signed)
New message      Pt c/o BP issue: STAT if pt c/o blurred vision, one-sided weakness or slurred speech  1. What are your last 5 BP readings? Before medication 160-170/70; 1hr after taking rx 119/65 2. Are you having any other symptoms (ex. Dizziness, headache, blurred vision, passed out)?  Pt states that he does not feel well 3. What is your BP issue? Pt states taking 12.5 carvedilol bid is making his bp drop too fast.  He want to know if he needs to have his medication adjusted?  Please call

## 2016-11-12 NOTE — Telephone Encounter (Signed)
Obtain a download of his CPAP unit.

## 2016-11-13 ENCOUNTER — Ambulatory Visit: Payer: PPO | Admitting: Physician Assistant

## 2016-11-18 ENCOUNTER — Encounter: Payer: Self-pay | Admitting: Cardiovascular Disease

## 2016-11-18 NOTE — Telephone Encounter (Signed)
Follow up  Pt voiced he thinks his cpap machine is causing his sleepiness and not his medication.  Pt c/o medication issue:  1. Name of Medication: triamterene-hydrochlorothiazide (maxzide-25) 37.5-25 once daily  2. How are you currently taking this medication (dosage and times per day)? See above  3. Are you having a reaction (difficulty breathing--STAT)? N/A  4. What is your medication issue? Pt voiced he only received a 15 day supply and would like for a nurse to give him a call in regards to needing more medication or as to why he only received 15 days worth of medication.  Please f/u with pt

## 2016-11-18 NOTE — Telephone Encounter (Signed)
Download ordered.

## 2016-11-19 ENCOUNTER — Telehealth: Payer: Self-pay | Admitting: Physician Assistant

## 2016-11-19 NOTE — Telephone Encounter (Signed)
New Message    Pt called he went to have download done to his CPAP machine and they dont have anyone to do it.    Does he need to have a Download or not?  His insurance said he doesn't need the Download, but [place] is going order the [facility] and get back with him

## 2016-11-19 NOTE — Telephone Encounter (Signed)
Can't give at least a 30 day supply of the medication.  May need to arrange a follow-up office visit.

## 2016-11-21 MED ORDER — TRIAMTERENE-HCTZ 37.5-25 MG PO TABS: 1.0000 | ORAL_TABLET | Freq: Every day | ORAL | 0 refills | Status: DC

## 2016-11-21 NOTE — Telephone Encounter (Signed)
Returned call to patient Dr.Kelly's advice given.Appointment scheduled with [facility] 11/26/16 at 11:40 am.

## 2016-11-26 ENCOUNTER — Encounter: Payer: Self-pay | Admitting: Cardiovascular Disease

## 2016-11-26 ENCOUNTER — Ambulatory Visit (INDEPENDENT_AMBULATORY_CARE_PROVIDER_SITE_OTHER): Payer: PPO | Admitting: Cardiovascular Disease

## 2016-11-26 VITALS — BP 130/68 | HR 74 | Ht 70.0 in | Wt 221.0 lb

## 2016-11-26 DIAGNOSIS — Z9989 Dependence on other enabling machines and devices: Secondary | ICD-10-CM

## 2016-11-26 DIAGNOSIS — E1142 Type 2 diabetes mellitus with diabetic polyneuropathy: Secondary | ICD-10-CM

## 2016-11-26 DIAGNOSIS — I2510 Atherosclerotic heart disease of native coronary artery without angina pectoris: Secondary | ICD-10-CM

## 2016-11-26 DIAGNOSIS — G4733 Obstructive sleep apnea (adult) (pediatric): Secondary | ICD-10-CM

## 2016-11-26 DIAGNOSIS — I10 Essential (primary) hypertension: Secondary | ICD-10-CM | POA: Diagnosis not present

## 2016-11-26 DIAGNOSIS — E785 Hyperlipidemia, unspecified: Secondary | ICD-10-CM | POA: Diagnosis not present

## 2016-11-26 DIAGNOSIS — Z794 Long term (current) use of insulin: Secondary | ICD-10-CM | POA: Diagnosis not present

## 2016-11-26 MED ORDER — TRIAMTERENE-HCTZ 37.5-25 MG PO TABS: 1.0000 | ORAL_TABLET | Freq: Every day | ORAL | 6 refills | Status: DC

## 2016-11-26 NOTE — Patient Instructions (Signed)
Dr Claiborne Billings will be ordering a new CPAP unit for you . [facility] will contact you once they have gotten approval from your insurance company. If you do not hear from [facility] with in 2-3 weeks please call back to notify Mariann Laster.  Your physician recommends that you schedule a follow-up appointment in: 3 months with Dr Claiborne Billings.

## 2016-11-26 NOTE — Progress Notes (Signed)
Patient ID: Brandon Herrera, male   DOB: 02-13-1946, 71 y.o.   MRN: [id number]    HPI: Brandon Herrera is a 71 y.o. male who presents to the office today for a 6 month cardiology followup evaluation.  Brandon Herrera has CAD and suffered in an inferior myocardial infarction in 1985 due to  total RCA occlusion. In 1990 he underwent PTCA of the circumflex and in 1991 directional coronary atherectomy of an eccentric ossified LAD stenosis. He is also status post interventions to circumflex coronary artery with his last intervention in 2005 at which time he also had a intervention to the LAD and first diagonal vessel. He has a history of type 2 diabetes mellitus, hypertension, and hyperlipidemia.  He also has remote history of prostate CA and is status post prostate seed implantation  In 2011 a sleep study demonstrated mild sleep apnea overall with an AHI of 7.44/hr but sleep apnea was moderate at 16.4/hr with REM sleep. He  dropped his oxygen saturation to 88%. He has been using CPAP therapy since 2011 and notes huge difference is in his sense of well-being.  A download from March 2013 through June 2013 showed 100% usage, averaging 7 hours and 29 minutes per night. At that time, his AHI was 3.5 per hour. He presently denies any breakthrough snoring. He denies residual daytime sleepiness. He denies restless legs.  Over the past year, he has remained fairly stable from a cardiac standpoint. Specifically he denies recurrent anginal symptoms. He believes his blood pressure has been controlled.  He has been active.  He admits to compliance with his medical regimen.  Recently, however, he has noticed more difficulty in having prolonged sleep.  He continues to have significant social issues with his brother.  5 weeks ago, he was hit in the head by a bull calf and sustained some mild head trauma.  He has noticed some floaters in his right eye.  Due to his fatigability, a download was obtained from his CPAP unit.  This revealed  excellent compliance with 100% of days used with him, averaging 8 hours and 46 minutes of sleep per night.  On 10 cm set pressure, AHI was 5.5, mainly due to and hypopnea index of 4.0, with an apnea index of 1.5.  He did not have any leak with his mask.   He underwent an echo Doppler study on 06/04/2016.  This revealed improvement of his LV function which was now 45-50%, improved from 35-40%.  There was grade 1 diastolic dysfunction and mild left atrial enlargement.    He continues to use CPAP with 100% compliance.  Recently, he has been having difficulty with his CPAP machine and at times it is not turning off and has had malfunction.  He received his last 9-lead CPAP unit in 2011.  A compliance report from 10/20/2016 through 11/18/2016 shows 100% usage days and 100% usage greater than 4 hours.  He is averaging 8 hours and 4 minutes of sleep per night.  At a 12 cm set pressure, apnea index was 2.1, hypopnea index 4.5, an AHI 6.6.  There was no leak.  [facility] and they have suggested getting one of the newer wireless units.  He continues to sleep well with CPAP and will not sleep without it.  He has been monitoring his blood pressures daily and his blood sugar.  He presents for evaluation.  Past Medical History:  Diagnosis Date  . Abnormal nuclear  stress test 03/03/2012   mod size inferior scar w/new anterolateral wall ischemia towards apex  . CAD (coronary artery disease)   . Diabetes mellitus ([place])   . Heart murmur    07/28/2008 ECHO: mild mitral annular ca+,AOV mildly sclerotic,mild LVH,mod.global hypokinesis,mild to mod post wall hypokinesis, EF 35-40%,LA mildly dilated  . Hyperlipidemia   . Hypertension   . Inferior MI ([place]) 1985   totalled RCA  . OSA on CPAP   . Prostate cancer ([place])    Seed implant    Past Surgical History:  Procedure Laterality Date  . CARDIAC CATHETERIZATION  02/11/2003   patent CX stent,chronically occluded RCA  .  CARDIAC CATHETERIZATION  10/29/2004   No evidence of restenosis LAD but 30-40% narrowing prox. to stent,widely patient CX, old subtotalled RCA  . CORONARY ANGIOPLASTY WITH STENT PLACEMENT  1995   CX & LAD  . CORONARY ANGIOPLASTY WITH STENT PLACEMENT  12/25/2001   LCX  . CORONARY ANGIOPLASTY WITH STENT PLACEMENT  11/07/2003   CX, planned stenting of LAD later  . CORONARY ANGIOPLASTY WITH STENT PLACEMENT  12/05/2003   LAD  . PERCUTANEOUS CORONARY ROTOBLATOR INTERVENTION (PCI-R)  1991   LAD  . PTCA  1990   CX    Allergies  Allergen Reactions  . Ace Inhibitors Cough    Cough with Benazepril   . Zetia [Ezetimibe] Other (See Comments)    myalgias    Current Outpatient Prescriptions  Medication Sig Dispense Refill  . acetaminophen (TYLENOL) 650 MG CR tablet Take one and a half (1 1/2) tablet (975 mg total) by mouth each morning. Take one (1) tablet (650 mg total) by mouth with lunch and supper and at bedtime.    Marland Kitchen amLODipine (NORVASC) 5 MG tablet Take 2 tablets (10 mg total) by mouth daily. 60 tablet 9  . aspirin 81 MG tablet Take 81 mg by mouth daily.    Marland Kitchen atorvastatin (LIPITOR) 40 MG tablet Take 1 tablet (40 mg total) by mouth daily. 30 tablet 10  . Biotin 5000 MCG CAPS Take 1 capsule by mouth daily.    . carvedilol (COREG) 12.5 MG tablet Take 1.5 tablets (18.75 mg total) by mouth 2 (two) times daily. 270 tablet 3  . celecoxib (CELEBREX) 200 MG capsule Take 200 mg by mouth daily.    . Cetirizine HCl 10 MG CAPS Take 1 capsule by mouth daily.    . Cholecalciferol (VITAMIN D3) 5000 units TABS Take 5,000 Units by mouth daily.    . clopidogrel (PLAVIX) 75 MG tablet Take 1 tablet (75 mg total) by mouth daily. 30 tablet 10  . gabapentin (NEURONTIN) 100 MG capsule Take 2-3 capsules by mouth daily.    Marland Kitchen glucose blood (ONE TOUCH ULTRA TEST) test strip Inject 1 strip as directed 2 (two) times daily.    . insulin NPH-regular Human (NOVOLIN 70/30) (70-30) 100 UNIT/ML injection Inject 50 Units into the  skin 2 (two) times daily.    . isosorbide mononitrate (IMDUR) 30 MG 24 hr tablet Take 1 tablet (30 mg total) by mouth daily. 30 tablet 10  . Melatonin 5 MG CAPS Take 5 mg by mouth at bedtime.    . metFORMIN (GLUCOPHAGE) 500 MG tablet Take 1,000 mg by mouth 2 (two) times daily.    . Multiple Vitamin (MULTIVITAMIN) tablet Take 1 tablet by mouth daily.    . Omega-3 Fatty Acids (KP FISH OIL) 1200 MG CAPS Take two (2) capsules by mouth each morning and one (1) capsule by mouth  each evening.    . triamterene-hydrochlorothiazide (MAXZIDE-25) 37.5-25 MG tablet Take 1 tablet by mouth daily. 30 tablet 6  . valsartan (DIOVAN) 160 MG tablet Take 1 tablet (160 mg total) by mouth daily. 30 tablet 10  . vitamin B-12 (CYANOCOBALAMIN) 1000 MCG tablet Take 1,000 mcg by mouth daily.     No current facility-administered medications for this visit.     SOCHX is notable that he is divorced. He does not have children he did have family issues in the past particularly with his brother. This seems to be improving. There is no tobacco or alcohol use.  ROS General: Negative; No fevers, chills, or night sweats;  HEENT: Negative; No changes in vision or hearing, sinus congestion, difficulty swallowing Pulmonary: Negative; No cough, wheezing, shortness of breath, hemoptysis Cardiovascular: See history of present illness No chest pain, presyncope, syncope, palpitations GI: Negative; No nausea, vomiting, diarrhea, or abdominal pain GU: Negative; No dysuria, hematuria, or difficulty voiding Musculoskeletal: Negative; no myalgias, joint pain, or weakness Hematologic/Oncology: Negative; no easy bruising, bleeding Endocrine: Negative; no heat/cold intolerance; no diabetes Neuro: Negative; no changes in balance, headaches Skin: Negative; No rashes or skin lesions Psychiatric: Negative; No behavioral problems, depression Sleep: Positive for obstructive sleep apnea on CPAP therapy with 100% compliance; No snoring, daytime  sleepiness, hypersomnolence, bruxism, restless legs, hypnogognic hallucinations, no cataplexy Other comprehensive 14 point system review is negative.  PE BP 130/68   Pulse 74   Ht _0  (1.778 m)   Wt 221 lb (100.2 kg)   BMI 31.71 kg/m    Repeat blood pressure by me 134/70  Wt Readings from Last 3 Encounters:  11/26/16 221 lb (100.2 kg)  06/04/16 226 lb 6.4 oz (102.7 kg)  04/19/16 224 lb 9.6 oz (101.9 kg)   General: Alert, oriented, no distress.  HEENT: Normocephalic.  Mild bruising over the left forehead from his recent injury from a bull calf.  Pupils round and reactive; sclera anicteric; Fundi mild arteriolar narrowing. No xanthelasmas Nose without nasal septal hypertrophy Mouth/Parynx benign; Mallinpatti scale 3 Neck: No JVD, no carotid bruits with normal carotid upstroke Chest wall: Nontender to palpation Lungs: clear to ausculatation and percussion; no wheezing or rales Heart: RRR, s1 s2 normal 1/6 systolic murmur, no S3 or S4 gallop; no rubs thrills or heaves Abdomen: soft, nontender; no hepatosplenomehaly, BS+; abdominal aorta nontender and not dilated by palpation. Back: No CVA tenderness Pulses 2+ Extremities: no clubbing cyanosis or edema, Homan's sign negative  Neurologic: grossly nonfocal, cranial nerves grossly normal. Psychological: Normal affect and mood   ECG (independently read by me): Normal sinus rhythm at 74 bpm.  Small nondiagnostic inferior Q waves.  Early transition.  Nonspecific T changes.  October 2017 ECG (independently read by me): Normal sinus rhythm with mild sinus arrhythmia at 85 bpm.  Q waves in leads III and F.  Previously noted T-wave abnormality inferolaterally.  ECG (independently interpreted by me): Normal sinus rhythm at 66 bpm.  Early transition.  Nonspecific T-wave changes V4 through V6 and in leads 2 and aVF.  ECG (independently interpreted by me): Normal sinus rhythm. Nonspecific T changes. Normal intervals.   LABS:  BMP Latest  Ref Rng & Units 05/02/2016 04/19/2016 04/11/2015  Glucose 65 - 99 mg/dL 209(H) 101(H) 144(H)  BUN 7 - 25 mg/dL _1 Creatinine 0.70 - 1.18 mg/dL 1.17 1.08 0.84  Sodium 135 - 146 mmol/L 139 140 140  Potassium 3.5 - 5.3 mmol/L 4.5 4.3 4.3  Chloride 98 -  110 mmol/L 104 104 107  CO2 20 - 31 mmol/L _0 Calcium 8.6 - 10.3 mg/dL 10.0 10.4(H) 9.7   Hepatic Function Latest Ref Rng & Units 04/11/2015  Total Protein 6.1 - 8.1 g/dL 6.3  Albumin 3.6 - 5.1 g/dL 4.1  AST 10 - 35 U/L 27  ALT 9 - 46 U/L 34  Alk Phosphatase 40 - 115 U/L 59  Total Bilirubin 0.2 - 1.2 mg/dL 0.5   CBC Latest Ref Rng & Units 05/02/2016 04/11/2015  WBC 3.8 - 10.8 K/uL 7.3 7.4  Hemoglobin 13.2 - 17.1 g/dL 13.1(L) 14.8  Hematocrit 38.5 - 50.0 % 39.4 43.3  Platelets 140 - 400 K/uL 195 179   Lab Results  Component Value Date   MCV 93.4 05/02/2016   MCV 91.2 04/11/2015   Lab Results  Component Value Date   TSH 1.497 04/11/2015   Lab Results  Component Value Date   HGBA1C 7.2 (H) 04/11/2015   Lipid Panel     Component Value Date/Time   CHOL 147 10/08/2016 0931   TRIG 92 10/08/2016 0931   HDL 52 10/08/2016 0931   CHOLHDL 2.8 10/08/2016 0931   VLDL 18 10/08/2016 0931   LDLCALC 77 10/08/2016 0931    RADIOLOGY: No results found.  IMPRESSION:  1. OSA on CPAP   2. Coronary artery disease involving native coronary artery of native heart without angina pectoris   3. Essential hypertension   4. Type 2 diabetes mellitus with diabetic polyneuropathy, with long-term current use of insulin ([place])   5. Hyperlipidemia with target LDL less than 70     ASSESSMENT AND PLAN: Brandon Herrera is a 71 year old Caucasian male who has established coronary artery disease documented for 33 years when he presented with his inferior wall myocardial infarction in 1985 was found to have total RCA occlusion. He also is status post remote PTCA of the circumflex in 1990, and in 1991 underwent [place] of an eccentrically calcified LAD  stenosis. His last intervention in 2005 was done to his LAD and first diagonal vessel which was stented. He has continued to be stable without recurrent anginal symptomatology. His blood pressure today remains relatively stable.  I reviewed his extensive blood pressure monitoring from home.  He is currently on amlodipine 5 mg, carvedilol 18.75 mg twice a day, isosorbide 30 mg, valsartan 160 mg, and Maxide 37.5/25mg.  His ventricular rate is 74 and he has previously noted T-wave abnormalities.  He is not having any anginal symptoms.  He is not having palpitations.  I was able to obtain a download today of his CPAP unit and he continues to be 100% compliant.  He his CPAP machine is over 54 years old.  He has begun to notice machine malfunction.  I am recommending a new CPAP machine with a ResMed air since 10 unit and I will increase his set pressure to 13 cm.  [facility] is his DME company.  He continues to be on atorvastatin 40 mg for hyperlipidemia.  Most recent cholesterol from 11/06/2016 revealed total cholesterol 147, triglycerides 92, HDL 52 and LDL 77. He is seeing Dr. Silvano Rusk at [facility] family practice.   He is diabetic on insulin and metformin.  He had been started on gabapentin, which I presume was for peripheral neuropathy, probably degenerative.  He has diabetes mellitus and  is currently on insulin and metformin for his diabetes.  He continues to use vitamin D daily.  He continues to be on aspirin and  Plavix for dual antiplatelet therapy.  I will see him in 3-4 months for reevaluation, following his obtaining a new CPAP machine and at that time a download will be obtained.  Time spent  25  Troy Sine, MD, [facility]  11/26/2016 12:45 PM

## 2016-11-28 NOTE — Telephone Encounter (Signed)
Patient came in and was seen by Dr Claiborne Billings and a new machine was ordered.

## 2016-12-03 ENCOUNTER — Other Ambulatory Visit: Payer: Self-pay | Admitting: Cardiovascular Disease

## 2016-12-09 ENCOUNTER — Telehealth: Payer: Self-pay | Admitting: Cardiovascular Disease

## 2016-12-09 NOTE — Telephone Encounter (Signed)
Unable to reach pt or leave a message voicemail has not been set up

## 2016-12-09 NOTE — Telephone Encounter (Signed)
New Message     Pt is calling about CPAP machine what does he need to do to get machine, someone called him asking for Credit Card number

## 2016-12-10 DIAGNOSIS — C61 Malignant neoplasm of prostate: Secondary | ICD-10-CM | POA: Diagnosis not present

## 2016-12-11 NOTE — Telephone Encounter (Signed)
Discussed with Erasmo Score and an order was place with [facility] for machine patient would need to follow up with them Tried to call patient and he has no VM set up, unable to reach

## 2016-12-12 NOTE — Telephone Encounter (Signed)
Spoke with pt, aware he will need to discuss his issues with [facility]. He was advised to contact [place].

## 2016-12-12 NOTE — Telephone Encounter (Signed)
Spoke with pt, aware the order has been faxed. He reports [facility] has told him the new CPAP machine was going to be $150 dollars and it had to be paid with a certain credit card. He does not have a credit card and he is having a lot of problems with [facility]. He would like to be referred to another company for his CPAP other than [facility]. Aware we can check into another facility but it will depend on his insurance if he could change. Will forward to wanda to see if she can help with this process. Pt agreed with this plan.

## 2016-12-12 NOTE — Telephone Encounter (Signed)
I have had multiple conversations with this patient here in the office explaining to him that he cannot use any other [facility] company due to his insurance. Patient has had multiple problems with [facility] and requested a change in the past. I was informed by [facility] the patient has a bad debt with their company. This may be likely reason they are requesting payment

## 2016-12-18 DIAGNOSIS — E119 Type 2 diabetes mellitus without complications: Secondary | ICD-10-CM | POA: Diagnosis not present

## 2016-12-18 DIAGNOSIS — I10 Essential (primary) hypertension: Secondary | ICD-10-CM | POA: Diagnosis not present

## 2016-12-18 DIAGNOSIS — E785 Hyperlipidemia, unspecified: Secondary | ICD-10-CM | POA: Diagnosis not present

## 2016-12-24 DIAGNOSIS — E1142 Type 2 diabetes mellitus with diabetic polyneuropathy: Secondary | ICD-10-CM | POA: Diagnosis not present

## 2016-12-24 DIAGNOSIS — E782 Mixed hyperlipidemia: Secondary | ICD-10-CM | POA: Diagnosis not present

## 2016-12-24 DIAGNOSIS — Z794 Long term (current) use of insulin: Secondary | ICD-10-CM | POA: Diagnosis not present

## 2016-12-24 DIAGNOSIS — I10 Essential (primary) hypertension: Secondary | ICD-10-CM | POA: Diagnosis not present

## 2017-01-05 ENCOUNTER — Other Ambulatory Visit: Payer: Self-pay | Admitting: Cardiovascular Disease

## 2017-01-06 NOTE — Telephone Encounter (Signed)
Rx(s) sent to pharmacy electronically.  

## 2017-01-14 ENCOUNTER — Telehealth: Payer: Self-pay | Admitting: Cardiovascular Disease

## 2017-01-14 NOTE — Telephone Encounter (Signed)
Patient calling, states that he has not been doing well. Patient states that he has been tired and just cannot sleep. Mr. Brandon Herrera went to see his PCP Dr. Krystal Clark and was instructed to wait about 4 weeks to see if he felt better. Patient states that he cannot wait another two weeks and would like to see what Dr. Claiborne Billings thinks he should do, thanks.

## 2017-01-14 NOTE — Telephone Encounter (Signed)
Returned call to patient-patient reports that he has been feeling tired and fatigued.  Reports he is not sleeping well and wakes up multiple times a night. Reports he has been experiencing this in the summer x 2-3 years.  Reports he is very active in the winter and rest well.  Reports every summer he becomes less active and becomes tired and fatigued.  Reports he has a lot of stress finaciallly and with family.  Denies any new medications , reports compliance with CPAP.  Denies CP, SOB, dizziness, lightheadedness. Reports decrease in appetite as well.  Reports BP and HR are "good", this am 140/71 HR 90.   Reports he is seeing his PCP about this but would like Dr. Claiborne Billings opinion.  Encouraged patient to increase activity to see if this would help with stress and help rest better.  Patient agreed.  Also encouraged to follow closely with PCP in regards to issue.  Patient agreed and verbalized understanding.    Routed to Dr. Claiborne Billings for further recommendations.

## 2017-01-20 NOTE — Telephone Encounter (Signed)
Need to increase activity.  Make sure he has appropriate sleep hygiene.  He continues to use CPAP with 100% compliance.

## 2017-01-23 NOTE — Telephone Encounter (Signed)
Patient aware to increase activity, reported compliance with CPAP.  PCP following-notes in epic ([facility] everywhere).

## 2017-01-30 ENCOUNTER — Telehealth: Payer: Self-pay | Admitting: Cardiovascular Disease

## 2017-01-30 NOTE — Telephone Encounter (Signed)
Called back with no answer. Unable to leave a message due to the voicemail not being set up.

## 2017-01-30 NOTE — Telephone Encounter (Signed)
New Message      Pt is very concerned with taken the  atorvastatin (LIPITOR) 40 MG tablet Take 1 tablet (40 mg total) by mouth daily.   He will would like to discuss it with you , he is just really tired on this medication

## 2017-01-31 NOTE — Telephone Encounter (Signed)
Attempted to contact patient. No VM set up .  ?

## 2017-02-03 NOTE — Telephone Encounter (Signed)
No answer, no voicemail set up.

## 2017-02-05 NOTE — Telephone Encounter (Signed)
Follow up      Returning a call to the nurse.  He has questions about taking a statin drug.

## 2017-02-05 NOTE — Telephone Encounter (Signed)
Called back, no answer and no voicemail set up.

## 2017-02-06 DIAGNOSIS — M9902 Segmental and somatic dysfunction of thoracic region: Secondary | ICD-10-CM | POA: Diagnosis not present

## 2017-02-06 DIAGNOSIS — S46012A Strain of muscle(s) and tendon(s) of the rotator cuff of left shoulder, initial encounter: Secondary | ICD-10-CM | POA: Diagnosis not present

## 2017-02-06 DIAGNOSIS — M25512 Pain in left shoulder: Secondary | ICD-10-CM | POA: Diagnosis not present

## 2017-02-06 DIAGNOSIS — M9901 Segmental and somatic dysfunction of cervical region: Secondary | ICD-10-CM | POA: Diagnosis not present

## 2017-02-07 DIAGNOSIS — S46012A Strain of muscle(s) and tendon(s) of the rotator cuff of left shoulder, initial encounter: Secondary | ICD-10-CM | POA: Diagnosis not present

## 2017-02-07 DIAGNOSIS — M9901 Segmental and somatic dysfunction of cervical region: Secondary | ICD-10-CM | POA: Diagnosis not present

## 2017-02-07 DIAGNOSIS — M25512 Pain in left shoulder: Secondary | ICD-10-CM | POA: Diagnosis not present

## 2017-02-07 DIAGNOSIS — M9902 Segmental and somatic dysfunction of thoracic region: Secondary | ICD-10-CM | POA: Diagnosis not present

## 2017-02-07 NOTE — Telephone Encounter (Signed)
No answer, no voicemail set up. Unable to contact pt will await call back

## 2017-02-10 DIAGNOSIS — M25512 Pain in left shoulder: Secondary | ICD-10-CM | POA: Diagnosis not present

## 2017-02-10 DIAGNOSIS — M9902 Segmental and somatic dysfunction of thoracic region: Secondary | ICD-10-CM | POA: Diagnosis not present

## 2017-02-10 DIAGNOSIS — S46012A Strain of muscle(s) and tendon(s) of the rotator cuff of left shoulder, initial encounter: Secondary | ICD-10-CM | POA: Diagnosis not present

## 2017-02-10 DIAGNOSIS — M9901 Segmental and somatic dysfunction of cervical region: Secondary | ICD-10-CM | POA: Diagnosis not present

## 2017-02-11 DIAGNOSIS — G4733 Obstructive sleep apnea (adult) (pediatric): Secondary | ICD-10-CM | POA: Diagnosis not present

## 2017-03-06 ENCOUNTER — Telehealth: Payer: Self-pay | Admitting: Cardiovascular Disease

## 2017-03-06 NOTE — Telephone Encounter (Signed)
Please avoid taking St John's Wort. May decrease efficacy of isosorbide and amlodipine.

## 2017-03-06 NOTE — Telephone Encounter (Signed)
Attempted to call patient. VM not set up.

## 2017-03-06 NOTE — Telephone Encounter (Signed)
New message  Pt call requesting to speak with RN to see if he would be ok to take medication [facility] his other medications. Please call back to discuss

## 2017-03-06 NOTE — Telephone Encounter (Signed)
Message routed to pharmacy staff for med review.

## 2017-03-07 NOTE — Telephone Encounter (Signed)
Unable to reach pt or leave a message  

## 2017-03-10 NOTE — Telephone Encounter (Signed)
Patient called and informed that he should not take [place] per the pharmacy's advice. He verbalized his understanding.

## 2017-04-01 ENCOUNTER — Other Ambulatory Visit: Payer: Self-pay | Admitting: Cardiovascular Disease

## 2017-04-01 DIAGNOSIS — E1142 Type 2 diabetes mellitus with diabetic polyneuropathy: Secondary | ICD-10-CM | POA: Diagnosis not present

## 2017-04-01 DIAGNOSIS — H9202 Otalgia, left ear: Secondary | ICD-10-CM | POA: Diagnosis not present

## 2017-04-01 DIAGNOSIS — Z794 Long term (current) use of insulin: Secondary | ICD-10-CM | POA: Diagnosis not present

## 2017-04-01 DIAGNOSIS — Z638 Other specified problems related to primary support group: Secondary | ICD-10-CM | POA: Diagnosis not present

## 2017-04-03 ENCOUNTER — Telehealth: Payer: Self-pay | Admitting: Cardiovascular Disease

## 2017-04-03 NOTE — Telephone Encounter (Signed)
Brandon Herrera is calling to find out when is the best time to take the amlodipine.  He takes it at night and wants to know if he needs to take it in the morning . Please call

## 2017-04-03 NOTE — Telephone Encounter (Signed)
Attempted to reach pt. No answer and vm not set up.

## 2017-04-03 NOTE — Telephone Encounter (Signed)
Spoke to patient . Patient wanted to know if the amlodipine was causing him to be sleep in the [place] IF HE TAKES IT NOW IN THE EVENINGS.  RN INFORMED  PATIENT HE COULD CHANGE TIME IF HE LIKES AS LONG AS HE TAKES MEDICATION AT THE SAME TIME.  ALSO HE MAY WANT TO DECREASE MELATONIN TO 5MG  FROM 10 MG EACH NIGHT.  PATIENT VERBALIZED UNDERSTANDING.

## 2017-04-09 ENCOUNTER — Telehealth: Payer: Self-pay | Admitting: Cardiovascular Disease

## 2017-04-09 MED ORDER — IRBESARTAN 150 MG PO TABS: 150.0000 mg | ORAL_TABLET | Freq: Every day | ORAL | 3 refills | Status: DC

## 2017-04-09 NOTE — Telephone Encounter (Signed)
Pt c/o medication issue:  1. Name of [place] like for pt to switch medicine  2. How are you currently taking this medication (dosage and times per day)? 3. Are you having a reaction (difficulty brathing--STAT)?  What is your medication issue?

## 2017-04-09 NOTE — Telephone Encounter (Signed)
Start Irbesartan 150mg  as directed by [facility] current memo. Patient has home BP cuff, will take BP at home every 2-3 days for 2 weeks to ensure no significant changes in BP. Pt  will call with any BP changes -or- s/e for further direction. Refill sent to pharmacy as requested.  While discussing this pt is asking what dosage of atorvastatin he should be taking he states that he was told to take 1/2 tablet, I do not see this documented. Please advise

## 2017-04-10 DIAGNOSIS — E782 Mixed hyperlipidemia: Secondary | ICD-10-CM | POA: Diagnosis not present

## 2017-04-10 DIAGNOSIS — E1142 Type 2 diabetes mellitus with diabetic polyneuropathy: Secondary | ICD-10-CM | POA: Diagnosis not present

## 2017-04-10 DIAGNOSIS — Z794 Long term (current) use of insulin: Secondary | ICD-10-CM | POA: Diagnosis not present

## 2017-04-10 NOTE — Telephone Encounter (Signed)
Pt is calling to find out what was decided about his medicine.

## 2017-04-10 NOTE — Telephone Encounter (Signed)
Returned call to patient. Explained med change from valsartan 160mg  to irbesartan 150mg  daily d/t recall. Asked that he monitor home BP for 2 weeks to ensure no significant changes w/BP  Reviewed chart and noted that on 08/14/2016 - patient was advised to take atorvastatin 20mg  daily (after holding the medication). He had repeat labs in Feb 2018 on this dose. Advised to continue this dose. Med list updated.   Routed to MD as Juluis Rainier

## 2017-04-11 NOTE — Telephone Encounter (Signed)
ok 

## 2017-04-24 ENCOUNTER — Telehealth: Payer: Self-pay | Admitting: Cardiovascular Disease

## 2017-04-24 DIAGNOSIS — E119 Type 2 diabetes mellitus without complications: Secondary | ICD-10-CM | POA: Diagnosis not present

## 2017-04-24 DIAGNOSIS — H2513 Age-related nuclear cataract, bilateral: Secondary | ICD-10-CM | POA: Diagnosis not present

## 2017-04-24 MED ORDER — IRBESARTAN 300 MG PO TABS: 300.0000 mg | ORAL_TABLET | Freq: Every day | ORAL | 3 refills | Status: DC

## 2017-04-24 NOTE — Telephone Encounter (Signed)
Patient is diabetic so would prefer to see readings < 151 systolic.  Have him increase irbesartan to 300 mg qd for a week and see if BP drops accordingly - he can call to let us know if numbers improved.  Have him be aware of low BP readings < 761 systolic or symptoms of hypotension.  If so, cut back to 150 mg again.

## 2017-04-24 NOTE — Telephone Encounter (Signed)
Spoke w patient regarding his BPs. He was switched from valsartan 160mg  to irbesartan 150mg  daily 2 weeks ago, d/t product recall. Notes he has been checking his BP at home and didn't know if he should be concerned. It has been averaging 140s/60s-70s when checked. Had gone up a bit because he used to average 130s/70s. He wants to make sure the ~10 pt difference does not require further correction. Informed him I'd seek review by pharmD and follow up.

## 2017-04-24 NOTE — Telephone Encounter (Signed)
Returned call, spoke to patient and detailed recommendations. He understands to increase irbesartan as recommended to 300mg  daily, but to pay attention to his BP readings and follow up with [place] in 1 week after changes to let us know how his BPs are running, and how he is feeling.   Patient aware that if he has low BPs (<793 systolic) to let us know right away and plan to return to 150mg  dose of irbesartan.  Patient aware to call sooner if new needs or concerns. He voiced thanks for call and assistance today.

## 2017-04-24 NOTE — Telephone Encounter (Signed)
New Message  Pt c/o medication issue:  1. Name of Medication: Irbesartan   2. How are you currently taking this medication (dosage and times per day)? 150mg    3. Are you having a reaction (difficulty breathing--STAT)? High bp   4. What is your medication issue? Per pt would like to discuss his high bp due to medication .please call back to discuss

## 2017-04-26 ENCOUNTER — Telehealth: Payer: Self-pay | Admitting: Internal Medicine

## 2017-04-26 NOTE — Telephone Encounter (Signed)
Received page from patient requesting call back. Page noted "BP high, new med is not working, having spasms in heart." Multiple callback attempts made. No answer. Not able to leave voicemail as voicemail box is not set up.   Lanna Poche, MD Cardiology fellow

## 2017-04-28 ENCOUNTER — Telehealth: Payer: Self-pay | Admitting: Cardiology

## 2017-04-28 NOTE — Telephone Encounter (Signed)
   Pt called reporting BP has been running higher recently. Pt was previously on Valsartan but changed to irbesartan after recall. Was placed on 160 mg daily but required further titration to 300 mg. Despite this, his BP has remained on the higher side. He is also on Coreg, amlodipine and HCTZ. He is on 10 mg of amlodipine. Pt reports SBPs have been in the 160s-170s. Pulse rate in the 80s-90s. He was instructed to increase his Coreg to 25 mg BID. He will notify our office if he is still having issues. Pt was instructed to call the office tomorrow to try to arrange f/u with APP or in our HTN clinic.   Brittainy Rosita Fire 04/28/2017

## 2017-04-29 ENCOUNTER — Telehealth: Payer: Self-pay | Admitting: Cardiovascular Disease

## 2017-04-29 NOTE — Telephone Encounter (Signed)
Pt c/o BP issue: STAT if pt c/o blurred vision, one-sided weakness or slurred speech  1. What are your last 5 BP readings? 186/58  2. Are you having any other symptoms (ex. Dizziness, headache, blurred vision, passed out)? Dizzy and sob  3. What is your BP issue? bp high

## 2017-04-29 NOTE — Telephone Encounter (Signed)
Agree with recommendations.  

## 2017-04-29 NOTE — Telephone Encounter (Signed)
Returned call to patient he stated he called Dr.on call last night because B/P 186/58 pulse 80.Stated Dr.increased Coreg 12.5 mg 1&1/2 tablets twice a day to 2 tablets twice a day.Stated this morning B/P 124/57 pulse 79.Stated he feels ok he wanted to let Dr.Kelly know.Advised to continue to monitor pulse and B/P.Follow up appointment scheduled with Dr.Kelly 10/9 at 10:00 am.Advised to call sooner if needed.

## 2017-05-03 ENCOUNTER — Telehealth: Payer: Self-pay | Admitting: Cardiology

## 2017-05-03 NOTE — Telephone Encounter (Signed)
Pt called complaining  Some sleepy feeling  He was wanting to know when to take Avapro - he will continue to take at night.   He will call if further issues.

## 2017-05-07 ENCOUNTER — Telehealth: Payer: Self-pay | Admitting: Cardiovascular Disease

## 2017-05-07 ENCOUNTER — Other Ambulatory Visit: Payer: Self-pay | Admitting: Cardiovascular Disease

## 2017-05-07 NOTE — Telephone Encounter (Signed)
Please call,question about his blood pressure medicine.

## 2017-05-07 NOTE — Telephone Encounter (Signed)
Spoke with pt he is averaging his BP 117/80 he is doing great on new medication.

## 2017-05-08 ENCOUNTER — Telehealth: Payer: Self-pay | Admitting: Cardiovascular Disease

## 2017-05-08 NOTE — Telephone Encounter (Signed)
Returned call to patient who states he was out in his garage, walked up steps into his house when he became lightheaded and dizzy, BP was 172/77 HR 93.    Reports he sat for 5-6 mins then retook his BP and it came down 144/65 HR 89, dizziness and lightheadedness resolved, asymptomatic at current.   Wondering if this is normal to flucuate this much.  Advised that it is expected for your BP to increase with exertion and decrease with rest, reassured patient.   Reports other BP readings are as follows:   9/12 AM  143/66 HR 79 169/84  134/59  129/68  9/13 AM before meds  144/65 HR 89 After medications 159/59  Reports his BP has been consistently running in the 140s HR 80s.      Per chart review, patient has had continued issues with BP.  Advised that it would be a good idea to be seen in our HTN clinic as previously suggested.  Patient states he is unable to afford any visits at this time.   Advised to continue to monitor BP, HR and I would route to HTN for clinic for review/further recommendations.

## 2017-05-08 NOTE — Telephone Encounter (Signed)
Blood pressure will fluctuate with during the day especially with exertion.  Recommendation:  1. STOP monitoring BP more that twice daily  2. Monitor BP every morning (between 1-2 hours after taking BP medication) and every evening before going to bed.   HOW TO TAKE YOUR BLOOD PRESSURE: . Rest 5 minutes before taking your blood pressure. .  Don't smoke or drink caffeinated beverages for at least 30 minutes before. . Sit comfortably with your back supported and both feet on the floor (don't cross your legs). . Elevate your arm to heart level on a table or a desk.  3. Keep records of all BP and pulse readings  4. Call back if daily BP average remains above or if any order problems are identified.   5. Schedule HTN follow up  ASAP and bring BP device and  BP readings with you.

## 2017-05-08 NOTE — Telephone Encounter (Signed)
New message    Pt c/o BP issue: STAT if pt c/o blurred vision, one-sided weakness or slurred speech  1. What are your last 5 BP readings? 172/77  2. Are you having any other symptoms (ex. Dizziness, headache, blurred vision, passed out)? No   3. What is your BP issue? Pt is calling about his bp going up

## 2017-05-09 NOTE — Telephone Encounter (Signed)
Patient made aware of recommendations and verbalized understanding.  Patient states he does not have the funds at the time to make an appt with HTN clinic.

## 2017-05-14 DIAGNOSIS — Z794 Long term (current) use of insulin: Secondary | ICD-10-CM | POA: Diagnosis not present

## 2017-05-14 DIAGNOSIS — E1165 Type 2 diabetes mellitus with hyperglycemia: Secondary | ICD-10-CM | POA: Diagnosis not present

## 2017-06-03 ENCOUNTER — Encounter: Payer: Self-pay | Admitting: Cardiovascular Disease

## 2017-06-03 ENCOUNTER — Ambulatory Visit (INDEPENDENT_AMBULATORY_CARE_PROVIDER_SITE_OTHER): Payer: PPO | Admitting: Cardiovascular Disease

## 2017-06-03 VITALS — BP 130/64 | HR 76 | Ht 70.0 in | Wt 220.8 lb

## 2017-06-03 DIAGNOSIS — E1142 Type 2 diabetes mellitus with diabetic polyneuropathy: Secondary | ICD-10-CM

## 2017-06-03 DIAGNOSIS — I10 Essential (primary) hypertension: Secondary | ICD-10-CM

## 2017-06-03 DIAGNOSIS — E785 Hyperlipidemia, unspecified: Secondary | ICD-10-CM | POA: Diagnosis not present

## 2017-06-03 DIAGNOSIS — Z9989 Dependence on other enabling machines and devices: Secondary | ICD-10-CM

## 2017-06-03 DIAGNOSIS — Z794 Long term (current) use of insulin: Secondary | ICD-10-CM | POA: Diagnosis not present

## 2017-06-03 DIAGNOSIS — I2510 Atherosclerotic heart disease of native coronary artery without angina pectoris: Secondary | ICD-10-CM | POA: Diagnosis not present

## 2017-06-03 DIAGNOSIS — G4733 Obstructive sleep apnea (adult) (pediatric): Secondary | ICD-10-CM | POA: Diagnosis not present

## 2017-06-03 MED ORDER — AMLODIPINE BESYLATE 10 MG PO TABS: 10.0000 mg | ORAL_TABLET | Freq: Every day | ORAL | 3 refills | Status: DC

## 2017-06-03 MED ORDER — CARVEDILOL 25 MG PO TABS: 25.0000 mg | ORAL_TABLET | Freq: Two times a day (BID) | ORAL | 3 refills | Status: DC

## 2017-06-03 MED ORDER — TRIAMTERENE-HCTZ 37.5-25 MG PO TABS: 1.0000 | ORAL_TABLET | ORAL | 6 refills | Status: DC

## 2017-06-03 NOTE — Patient Instructions (Signed)
Medication Instructions:  DECREASE triamterene-hctz (Maxzide) 37.5-25 mg to every other day  Follow-Up: Your physician wants you to follow-up in: 12 MONTHS with Dr. Claiborne Billings. You will receive a reminder letter in the mail two months in advance. If you don't receive a letter, please call our office to schedule the follow-up appointment.   Any Other Special Instructions Will Be Listed Below (If Applicable).     If you need a refill on your cardiac medications before your next appointment, please call your pharmacy.

## 2017-06-03 NOTE — Progress Notes (Signed)
Patient ID: Brandon Herrera, male   DOB: August 13, 1946, 71 y.o.   MRN: [id number]    HPI: Brandon Herrera is a 71 y.o. male who presents to the office today for a 6 month cardiology followup evaluation.  Brandon Herrera has CAD and suffered in an inferior myocardial infarction in 1985 due to  total RCA occlusion. In 1990 he underwent PTCA of the circumflex and in 1991 directional coronary atherectomy of an eccentric ossified LAD stenosis. He is also status post interventions to circumflex coronary artery with his last intervention in 2005 at which time he also had a intervention to the LAD and first diagonal vessel. He has a history of type 2 diabetes mellitus, hypertension, and hyperlipidemia.  He also has remote history of prostate CA and is status post prostate seed implantation  In 2011 a sleep study demonstrated mild sleep apnea overall with an AHI of 7.44/hr but sleep apnea was moderate at 16.4/hr with REM sleep. He  dropped his oxygen saturation to 88%. He has been using CPAP therapy since 2011 and notes huge difference is in his sense of well-being.  A download from March 2013 through June 2013 showed 100% usage, averaging 7 hours and 29 minutes per night. At that time, his AHI was 3.5 per hour. He presently denies any breakthrough snoring. He denies residual daytime sleepiness. He denies restless legs.  Over the past year, he has remained fairly stable from a cardiac standpoint. Specifically he denies recurrent anginal symptoms. He believes his blood pressure has been controlled.  He has been active.  He admits to compliance with his medical regimen.  Recently, however, he has noticed more difficulty in having prolonged sleep.  He continues to have significant social issues with his brother.  5 weeks ago, he was hit in the head by a bull calf and sustained some mild head trauma.  He has noticed some floaters in his right eye.  Due to his fatigability, a download was obtained from his CPAP unit.  This revealed  excellent compliance with 100% of days used with him, averaging 8 hours and 46 minutes of sleep per night.  On 10 cm set pressure, AHI was 5.5, mainly due to and hypopnea index of 4.0, with an apnea index of 1.5.  He did not have any leak with his mask.   He underwent an echo Doppler study on 06/04/2016.  This revealed improvement of his LV function which was now 45-50%, improved from 35-40%.  There was grade 1 diastolic dysfunction and mild left atrial enlargement.    He  uses CPAP with 100% compliance.  A compliance report from 10/20/2016 through 11/18/2016 showed100% usage days and 100% usage greater than 4 hours.  He is averaging 8 hours and 4 minutes of sleep per night.  At a 12 cm set pressure, apnea index was 2.1, hypopnea index 4.5, an AHI 6.6.  There was no leak.  [facility] and  suggested getting one of the newer wireless units.  I recommended the ResMed AirSense 10 unit.   Over the past 6 months, he has felt well.  He continues to use CPAP.  He denies chest pain or palpitations.  Over the past several days.  He was "hauling hay" on days when it is been very hot.  He has held his diuretic.  He is unaware of palpitations.  He presents for evaluation  Past Medical History:  Diagnosis Date  . Abnormal  nuclear stress test 03/03/2012   mod size inferior scar w/new anterolateral wall ischemia towards apex  . CAD (coronary artery disease)   . Diabetes mellitus ([place])   . Heart murmur    07/28/2008 ECHO: mild mitral annular ca+,AOV mildly sclerotic,mild LVH,mod.global hypokinesis,mild to mod post wall hypokinesis, EF 35-40%,LA mildly dilated  . Hyperlipidemia   . Hypertension   . Inferior MI ([place]) 1985   totalled RCA  . OSA on CPAP   . Prostate cancer ([place])    Seed implant    Past Surgical History:  Procedure Laterality Date  . CARDIAC CATHETERIZATION  02/11/2003   patent CX stent,chronically occluded RCA  . CARDIAC CATHETERIZATION   10/29/2004   No evidence of restenosis LAD but 30-40% narrowing prox. to stent,widely patient CX, old subtotalled RCA  . CORONARY ANGIOPLASTY WITH STENT PLACEMENT  1995   CX & LAD  . CORONARY ANGIOPLASTY WITH STENT PLACEMENT  12/25/2001   LCX  . CORONARY ANGIOPLASTY WITH STENT PLACEMENT  11/07/2003   CX, planned stenting of LAD later  . CORONARY ANGIOPLASTY WITH STENT PLACEMENT  12/05/2003   LAD  . PERCUTANEOUS CORONARY ROTOBLATOR INTERVENTION (PCI-R)  1991   LAD  . PTCA  1990   CX    Allergies  Allergen Reactions  . Ace Inhibitors Cough    Cough with Benazepril   . Zetia [Ezetimibe] Other (See Comments)    myalgias    Current Outpatient Prescriptions  Medication Sig Dispense Refill  . acetaminophen (TYLENOL) 650 MG CR tablet Take one and a half (1 1/2) tablet (975 mg total) by mouth each morning. Take one (1) tablet (650 mg total) by mouth with lunch and supper and at bedtime.    Marland Kitchen amLODipine (NORVASC) 10 MG tablet Take 1 tablet (10 mg total) by mouth daily. 90 tablet 3  . aspirin 81 MG tablet Take 81 mg by mouth daily.    Marland Kitchen atorvastatin (LIPITOR) 40 MG tablet Take 20 mg by mouth daily.    . Biotin 5000 MCG CAPS Take 1 capsule by mouth daily.    . carvedilol (COREG) 25 MG tablet Take 1 tablet (25 mg total) by mouth 2 (two) times daily with a meal. 180 tablet 3  . celecoxib (CELEBREX) 200 MG capsule Take 200 mg by mouth daily.    . Cetirizine HCl 10 MG CAPS Take 1 capsule by mouth daily.    . Cholecalciferol (VITAMIN D3) 5000 units TABS Take 5,000 Units by mouth daily.    . clopidogrel (PLAVIX) 75 MG tablet Take 1 tablet (75 mg total) by mouth daily. 30 tablet 10  . glucose blood (ONE TOUCH ULTRA TEST) test strip Inject 1 strip as directed 2 (two) times daily.    . insulin NPH-regular Human (NOVOLIN 70/30) (70-30) 100 UNIT/ML injection Inject 50 Units into the skin 2 (two) times daily.    . irbesartan (AVAPRO) 300 MG tablet Take 1 tablet (300 mg total) by mouth daily. 30 tablet 3  .  isosorbide mononitrate (IMDUR) 30 MG 24 hr tablet Take 1 tablet (30 mg total) by mouth daily. 30 tablet 10  . metFORMIN (GLUCOPHAGE) 500 MG tablet Take 1,000 mg by mouth 2 (two) times daily.    . Multiple Vitamin (MULTIVITAMIN) tablet Take 1 tablet by mouth daily.    . Omega-3 Fatty Acids (KP FISH OIL) 1200 MG CAPS Take two (2) capsules by mouth each morning and one (1) capsule by mouth each evening.    . triamterene-hydrochlorothiazide (MAXZIDE-25) 37.5-25 MG tablet  Take 1 tablet by mouth every other day. 30 tablet 6  . vitamin B-12 (CYANOCOBALAMIN) 1000 MCG tablet Take 1,000 mcg by mouth daily.     No current facility-administered medications for this visit.     SOCHX is notable that he is divorced. He does not have children he did have family issues in the past particularly with his brother. This seems to be improving. There is no tobacco or alcohol use.  ROS General: Negative; No fevers, chills, or night sweats;  HEENT: Negative; No changes in vision or hearing, sinus congestion, difficulty swallowing Pulmonary: Negative; No cough, wheezing, shortness of breath, hemoptysis Cardiovascular: See history of present illness No chest pain, presyncope, syncope, palpitations GI: Negative; No nausea, vomiting, diarrhea, or abdominal pain GU: Negative; No dysuria, hematuria, or difficulty voiding Musculoskeletal: Negative; no myalgias, joint pain, or weakness Hematologic/Oncology: Negative; no easy bruising, bleeding Endocrine: Negative; no heat/cold intolerance; no diabetes Neuro: Negative; no changes in balance, headaches Skin: Negative; No rashes or skin lesions Psychiatric: Negative; No behavioral problems, depression Sleep: Positive for obstructive sleep apnea on CPAP therapy with 100% compliance; No snoring, daytime sleepiness, hypersomnolence, bruxism, restless legs, hypnogognic hallucinations, no cataplexy Other comprehensive 14 point system review is negative.  PE BP 130/64   Pulse  76   Ht _0  (1.778 m)   Wt 220 lb 12.8 oz (100.2 kg)   BMI 31.68 kg/m    Repeat blood pressure by me was 130/70  Wt Readings from Last 3 Encounters:  06/03/17 220 lb 12.8 oz (100.2 kg)  11/26/16 221 lb (100.2 kg)  06/04/16 226 lb 6.4 oz (102.7 kg)   General: Alert, oriented, no distress.  Skin: normal turgor, no rashes, warm and dry HEENT: Normocephalic, atraumatic. Pupils equal round and reactive to light; sclera anicteric; extraocular muscles intact;  Nose without nasal septal hypertrophy Mouth/Parynx benign; Mallinpatti scale 3 Neck: No JVD, no carotid bruits; normal carotid upstroke Lungs: clear to ausculatation and percussion; no wheezing or rales Chest wall: without tenderness to palpitation Heart: PMI not displaced, RRR, s1 s2 normal, 1/6 systolic murmur, no diastolic murmur, no rubs, gallops, thrills, or heaves Abdomen: soft, nontender; no hepatosplenomehaly, BS+; abdominal aorta nontender and not dilated by palpation. Back: no CVA tenderness Pulses 2+ Musculoskeletal: full range of motion, normal strength, no joint deformities Extremities: no clubbing cyanosis or edema, Homan's sign negative  Neurologic: grossly nonfocal; Cranial nerves grossly wnl Psychologic: Normal mood and affect   ECG (independently read by me): Normal sinus rhythm at 76 bpm.  Nonspecific T changes.  Normal intervals.  No ectopy.  April 2018 ECG (independently read by me): Normal sinus rhythm at 74 bpm.  Small nondiagnostic inferior Q waves.  Early transition.  Nonspecific T changes.  October 2017 ECG (independently read by me): Normal sinus rhythm with mild sinus arrhythmia at 85 bpm.  Q waves in leads III and F.  Previously noted T-wave abnormality inferolaterally.  ECG (independently interpreted by me): Normal sinus rhythm at 66 bpm.  Early transition.  Nonspecific T-wave changes V4 through V6 and in leads 2 and aVF.  ECG (independently interpreted by me): Normal sinus rhythm. Nonspecific T  changes. Normal intervals.   LABS:  BMP Latest Ref Rng & Units 05/02/2016 04/19/2016 04/11/2015  Glucose 65 - 99 mg/dL 209(H) 101(H) 144(H)  BUN 7 - 25 mg/dL _1 Creatinine 0.70 - 1.18 mg/dL 1.17 1.08 0.84  Sodium 135 - 146 mmol/L 139 140 140  Potassium 3.5 - 5.3 mmol/L 4.5 4.3  4.3  Chloride 98 - 110 mmol/L 104 104 107  CO2 20 - 31 mmol/L _0 Calcium 8.6 - 10.3 mg/dL 10.0 10.4(H) 9.7   Hepatic Function Latest Ref Rng & Units 04/11/2015  Total Protein 6.1 - 8.1 g/dL 6.3  Albumin 3.6 - 5.1 g/dL 4.1  AST 10 - 35 U/L 27  ALT 9 - 46 U/L 34  Alk Phosphatase 40 - 115 U/L 59  Total Bilirubin 0.2 - 1.2 mg/dL 0.5   CBC Latest Ref Rng & Units 05/02/2016 04/11/2015  WBC 3.8 - 10.8 K/uL 7.3 7.4  Hemoglobin 13.2 - 17.1 g/dL 13.1(L) 14.8  Hematocrit 38.5 - 50.0 % 39.4 43.3  Platelets 140 - 400 K/uL 195 179   Lab Results  Component Value Date   MCV 93.4 05/02/2016   MCV 91.2 04/11/2015   Lab Results  Component Value Date   TSH 1.497 04/11/2015   Lab Results  Component Value Date   HGBA1C 7.2 (H) 04/11/2015   Lipid Panel     Component Value Date/Time   CHOL 147 10/08/2016 0931   TRIG 92 10/08/2016 0931   HDL 52 10/08/2016 0931   CHOLHDL 2.8 10/08/2016 0931   VLDL 18 10/08/2016 0931   LDLCALC 77 10/08/2016 0931    RADIOLOGY: No results found.  IMPRESSION:  1. Coronary artery disease involving native coronary artery of native heart without angina pectoris   2. Essential hypertension   3. OSA on CPAP   4. Hyperlipidemia with target LDL less than 70   5. Type 2 diabetes mellitus with diabetic polyneuropathy, with long-term current use of insulin (HCC)     ASSESSMENT AND PLAN: Mr. Lograsso is a 71 year old Caucasian male who has established coronary artery disease documented for 33 years when he presented with his inferior wall myocardial infarction in 1985 was found to have total RCA occlusion. He also is status post remote PTCA of the circumflex in 1990.  In 1991 he  underwent DCA of an eccentrically calcified LAD stenosis. His last intervention in 2005 was done to his LAD and first diagonal vessel which was stented. He has continued to be stable without recurrent anginal symptomatology. His blood pressure today remains relatively stable. I reviewed his blood pressure recordings from home.  He brought a complete list with him today.  His blood pressure today is stable on amlodipine 10 mg, carvedilol 25 mg twice a day, irbesartan 300 mg daily, isosorbide 30 mg, and his Maxide.  I have suggested that he can reduce his Maxide to every other day.  Since there is no edema, particular when he is working hard outside in the heat to avoid dehydration and renal insufficiency.  He is on metformin and insulin for his diabetes mellitus.  He is not having any anginal symptoms on his current anti-ischemic and hypertensive medical regimen.  He is on Lipitor 20 mg with target LDL less than 70.  He has obstructive sleep apnea on CPAP therapy with 100% compliance.  He is now followed by her primary physician in [place].  He'll be checking laboratory.  I will see him in one year for reevaluation.    Time spent  [place], MD, [facility]  06/05/2017 5:30 PM

## 2017-06-06 ENCOUNTER — Telehealth: Payer: Self-pay | Admitting: Cardiovascular Disease

## 2017-06-06 NOTE — Telephone Encounter (Signed)
Would recommend a 2-3 week statin holiday.  Also concerned that he takes celebrex.  Does he take this daily?  Would recommend that because of increased risk of MI/stroke he not take unless absolutely necessary.

## 2017-06-06 NOTE — Telephone Encounter (Signed)
Returned call to patient. He got all info needed per previous conversation and repeated back instructions.

## 2017-06-06 NOTE — Telephone Encounter (Signed)
Returned call to patient of Dr. Claiborne Billings who was seen on 10/9. He states he mentioned this to MD at his visit. He takes celebrex and he used to take gabapentin. He has tingling in his legs at night. He has leg pain while walking. He reports he drinks a few beers every night. He reports symptoms for a few weeks. He reports he tries to stay well hydrated. Advised patient he could try OTC magnesium or Co-Q10 for leg cramping/aches. He is on atorvastatin 20mg  QD as well. He asked if this, or amlodipine could cause him symptoms. Advised would have pharmacist review meds for potential SE.   Recommend a statin holiday?

## 2017-06-06 NOTE — Telephone Encounter (Signed)
New message   Pt is calling asking for a call back. He wants to talk about his medication and leg pain.

## 2017-06-06 NOTE — Telephone Encounter (Signed)
Patient called w/recommendations. Advised to call with update on symptoms in a few weeks after holding lipitor. Advised him to reach out to prescriber of celebrex for alternative.   Call dropped. Attempted to return call, phone rang and went to VM

## 2017-06-09 ENCOUNTER — Telehealth: Payer: Self-pay | Admitting: Cardiovascular Disease

## 2017-06-09 NOTE — Telephone Encounter (Signed)
Pt notified he states that he has been taking this for over 2 years daily,  He will stop Celebrex today and take tylenol 650mg -arthritis strength until PCP calls back

## 2017-06-09 NOTE — Telephone Encounter (Signed)
Patient should avoid chronic use of ALL NSAIDs (celebrex, ibuprofen, naproxen, etc). Okay to take for short-term only if no other alternative available.  He should contact PCP for any additional recommendations related to Celebrex and pain management.

## 2017-06-09 NOTE — Telephone Encounter (Signed)
NEw Message  Pt c/o medication issue:  1. Name of Medication: Celebrex and Irbesartan  2. How are you currently taking this medication (dosage and times per day)? 200mg  and 300mg   3. Are you having a reaction (difficulty breathing--STAT)? no  4. What is your medication issue? Per pt would like to discuss these medications with RN. Please call back to discuss

## 2017-06-09 NOTE — Telephone Encounter (Signed)
S/w pt he states that his PCP is out of the office this week and they have a message to on call but pt is concerned with stopping Celebrex (he takes daily), he is concerned because paperwork states that S/E is heart attack. Told pt to call them back tomorrow, he states that he does not want to "get them mad"

## 2017-06-10 DIAGNOSIS — C61 Malignant neoplasm of prostate: Secondary | ICD-10-CM | POA: Diagnosis not present

## 2017-06-13 ENCOUNTER — Telehealth: Payer: Self-pay | Admitting: Cardiovascular Disease

## 2017-06-13 NOTE — Telephone Encounter (Signed)
Spoke with Aldona Bar at PCP office. Patient called their office inquiring about celebrex. See phone note 10/15 for reference. Explained to [facility] recommendations per cardiology. She will pass along to PCP for recommendations for patient.

## 2017-06-23 DIAGNOSIS — G4733 Obstructive sleep apnea (adult) (pediatric): Secondary | ICD-10-CM | POA: Diagnosis not present

## 2017-06-24 DIAGNOSIS — T387X5A Adverse effect of androgens and anabolic congeners, initial encounter: Secondary | ICD-10-CM | POA: Diagnosis not present

## 2017-06-24 DIAGNOSIS — E291 Testicular hypofunction: Secondary | ICD-10-CM | POA: Diagnosis not present

## 2017-06-24 DIAGNOSIS — C61 Malignant neoplasm of prostate: Secondary | ICD-10-CM | POA: Diagnosis not present

## 2017-07-01 ENCOUNTER — Other Ambulatory Visit: Payer: Self-pay | Admitting: Cardiovascular Disease

## 2017-07-01 NOTE — Telephone Encounter (Signed)
REFILL 

## 2017-07-07 ENCOUNTER — Other Ambulatory Visit: Payer: Self-pay | Admitting: Cardiovascular Disease

## 2017-07-07 NOTE — Telephone Encounter (Signed)
Rx has been sent to the pharmacy electronically. ° °

## 2017-07-10 DIAGNOSIS — E1142 Type 2 diabetes mellitus with diabetic polyneuropathy: Secondary | ICD-10-CM | POA: Diagnosis not present

## 2017-07-10 DIAGNOSIS — Z794 Long term (current) use of insulin: Secondary | ICD-10-CM | POA: Diagnosis not present

## 2017-07-15 DIAGNOSIS — E782 Mixed hyperlipidemia: Secondary | ICD-10-CM | POA: Diagnosis not present

## 2017-07-15 DIAGNOSIS — Z794 Long term (current) use of insulin: Secondary | ICD-10-CM | POA: Diagnosis not present

## 2017-07-15 DIAGNOSIS — E1142 Type 2 diabetes mellitus with diabetic polyneuropathy: Secondary | ICD-10-CM | POA: Diagnosis not present

## 2017-07-22 ENCOUNTER — Telehealth: Payer: Self-pay | Admitting: Cardiovascular Disease

## 2017-07-22 NOTE — Telephone Encounter (Signed)
Also routed to CVRR pool for assistance with statin

## 2017-07-22 NOTE — Telephone Encounter (Signed)
Pt calling regarding being off the Atorvastatin has helped him feel better. Was having trouble with legs and feet and told to go off 2-3 weeks. Would like to try Pravastatin if poss-uses [facility]-pls advise

## 2017-07-23 MED ORDER — PRAVASTATIN SODIUM 40 MG PO TABS: 40.0000 mg | ORAL_TABLET | Freq: Every evening | ORAL | 1 refills | Status: DC

## 2017-07-23 NOTE — Telephone Encounter (Signed)
Talked to Brandon Herrera today. He is unable to tolerate atorvastatin 40mg  daily or atorvastatin 20mg  daily. Mentioned using crestor in the past but stopped for unknown reason.  Will try pravastatin 40mg  daily and repeat blood work in 6-8 weeks. Will consider PCSK9 inhibitors if patient unable to tolerate pravastatin.  Rx sent to prefer pharmacy today 07/23/2017

## 2017-07-23 NOTE — Telephone Encounter (Signed)
Agree with attempt

## 2017-08-08 ENCOUNTER — Telehealth: Payer: Self-pay | Admitting: Cardiovascular Disease

## 2017-08-08 MED ORDER — PRAVASTATIN SODIUM 40 MG PO TABS: 40.0000 mg | ORAL_TABLET | Freq: Every evening | ORAL | 1 refills | Status: DC

## 2017-08-08 MED ORDER — TRIAMTERENE-HCTZ 37.5-25 MG PO TABS: 1.0000 | ORAL_TABLET | ORAL | 6 refills | Status: DC

## 2017-08-08 NOTE — Telephone Encounter (Signed)
Rx sent 

## 2017-08-08 NOTE — Telephone Encounter (Signed)
New message    Patient is completely out   *STAT* If patient is at the pharmacy, call can be transferred to refill team.   1. Which medications need to be refilled? (please list name of each medication and dose if known)   triamterene-hydrochlorothiazide (MAXZIDE-25) 37.5-25 MG tablet Take 1 tablet by mouth every other day.    2. Which pharmacy/location (including street and city if local pharmacy) is medication to be sent to? [facility]  3. Do they need a 30 day or 90 day supply? [place]

## 2017-08-11 ENCOUNTER — Telehealth: Payer: Self-pay | Admitting: Cardiovascular Disease

## 2017-08-11 NOTE — Telephone Encounter (Signed)
New message   Patient states medication is making him very tired since dosage increased.  Please call  Pt c/o medication issue:  1. Name of Medication: irbesartan (AVAPRO) 300 MG tablet and pravastatin (PRAVACHOL) 40 MG tablet 2. How are you currently taking this medication (dosage and times per day)?  As prescribed  3. Are you having a reaction (difficulty breathing--STAT)? Some shortness of breath for a few days  4. What is your medication issue? No energy, very tired

## 2017-08-11 NOTE — Telephone Encounter (Signed)
Spoke with pt, he is having dizziness and a general feeling of fatigue. He has been changed from atorvastatin to pravastatin recently. He also reports a weak feeling in his legs when he tries to walk. His bp is 137/67. He had these symptoms previously while on lipitor and he got better once he stopped it. Okay given for patient to stop the pravastatin for about 2-3 weeks. He is to call and let us know how he is feeling after being off the pravastatin. He also reports productive cough of green colored sputum. He was advised toi call his medical doctor.

## 2017-08-11 NOTE — Telephone Encounter (Signed)
Unable to reach pt or leave a message  

## 2017-08-11 NOTE — Telephone Encounter (Signed)
Returned call to patient no answer.Unable to leave a message no voice mail. ?

## 2017-08-11 NOTE — Telephone Encounter (Signed)
F/U Call:  Patient returning your call

## 2017-08-13 ENCOUNTER — Telehealth: Payer: Self-pay | Admitting: Cardiovascular Disease

## 2017-08-13 NOTE — Telephone Encounter (Signed)
Unable to reach pt or leave a message  

## 2017-08-13 NOTE — Telephone Encounter (Signed)
New message    Patient calling with concerns about cholesterol and pain in legs. Patient wants to know the significance of taking pravastatin (PRAVACHOL) 40 MG tablet in the evening. Please call    Pt c/o medication issue:  1. Name of Medication: pravastatin (PRAVACHOL) 40 MG tablet  2. How are you currently taking this medication (dosage and times per day)? Take 1 tablet (40 mg total) by mouth every evening.  3. Are you having a reaction (difficulty breathing--STAT)? No  4. What is your medication issue? n/a

## 2017-08-15 NOTE — Telephone Encounter (Signed)
Attempted to reach patient. No answer, no VM setup. °

## 2017-08-18 NOTE — Telephone Encounter (Signed)
Unable to reach pt or leave a message mailbox is not set up. 

## 2017-09-01 ENCOUNTER — Other Ambulatory Visit: Payer: Self-pay | Admitting: Cardiovascular Disease

## 2017-09-06 ENCOUNTER — Other Ambulatory Visit: Payer: Self-pay | Admitting: Cardiovascular Disease

## 2017-09-08 NOTE — Telephone Encounter (Signed)
Rx(s) sent to pharmacy electronically.  

## 2017-09-17 ENCOUNTER — Telehealth: Payer: Self-pay | Admitting: Cardiovascular Disease

## 2017-09-17 NOTE — Telephone Encounter (Signed)
Returned call to patient.He stated he has been having increased swelling in lower legs.Also pain in legs since he started taking Pravastatin.Advised to take Maxzide 1 tablet every day for the next 3 days then return to normal dose.Advised to stop Pravastatin.Advised I will send message to [facility] for advice.

## 2017-09-17 NOTE — Telephone Encounter (Signed)
When I last saw him, he was on atorvastatin.  If he has recently been changed to pravastatin would hold for 5 days then resume at I/2 dose every third day and advance to qod as tolerated.  If myalgis recur check cpk.

## 2017-09-17 NOTE — Telephone Encounter (Signed)
Brandon Herrera is calling because his legs are still swollen and he is having trouble walking ,even his socks is tight and leaving indents on his legs . Please call

## 2017-09-22 NOTE — Telephone Encounter (Signed)
Spoke to patient Dr.Kelly's recommendations given.Advised to call back if develops myalgias.He will need cpk.

## 2017-09-22 NOTE — Addendum Note (Signed)
Addended by: Kathyrn Lass on: 09/22/2017 09:27 AM   Modules accepted: Orders

## 2017-10-16 DIAGNOSIS — G4733 Obstructive sleep apnea (adult) (pediatric): Secondary | ICD-10-CM | POA: Diagnosis not present

## 2017-10-27 ENCOUNTER — Telehealth: Payer: Self-pay | Admitting: Cardiovascular Disease

## 2017-10-27 NOTE — Telephone Encounter (Signed)
Follow Up:  Please call,pt says he needs to hear something.

## 2017-10-27 NOTE — Telephone Encounter (Signed)
Spoke with patient and he has been having swelling in his legs around his socks for a while. Does go down at night. At last office visit Maxzide 25 was decreased to every other day but patient only taking Monday, Wednesday, and Friday only. When he presses his leg he does not notice an indention. Weight stable at around 210-212. Blood pressure he checks in the morning prior to his medications and is running 140's-150's/70's. Denies any pain or shortness of breath. Will forward to Dr Claiborne Billings for review

## 2017-10-27 NOTE — Telephone Encounter (Signed)
New message    Pt c/o swelling: STAT is pt has developed SOB within 24 hours  Patient feels medication needs adjustment. Please call  1) How much weight have you gained and in what time span? N/A  2) If swelling, where is the swelling located? LEGS, ANKLES  3) Are you currently taking a fluid pill? YES  4) Are you currently SOB? NO  5) Do you have a log of your daily weights (if so, list)? N/A  6) Have you gained 3 pounds in a day or 5 pounds in a week? N/A  7) Have you traveled recently? NO

## 2017-10-27 NOTE — Telephone Encounter (Signed)
Tired to return call to patient, no voicemail set up. Will try again later

## 2017-10-28 NOTE — Telephone Encounter (Signed)
Discussed with Janan Ridge PA and since patient is not having any shortness of breath and weight is stable would not increase the Maxzide back to daily. Try to keep legs elevated and avoid sodium. Increase Carvedilol to 25 mg twice a day to get better control of blood pressure.   Unable to reach via phone

## 2017-10-29 NOTE — Telephone Encounter (Signed)
agree

## 2017-10-31 NOTE — Telephone Encounter (Signed)
Tried to call patient again, no vm set up

## 2017-11-02 ENCOUNTER — Other Ambulatory Visit: Payer: Self-pay | Admitting: Cardiovascular Disease

## 2017-11-05 ENCOUNTER — Encounter: Payer: Self-pay | Admitting: *Deleted

## 2017-11-05 NOTE — Telephone Encounter (Signed)
No voicemail set up, mailed letter to contact office

## 2017-11-17 DIAGNOSIS — Z794 Long term (current) use of insulin: Secondary | ICD-10-CM | POA: Diagnosis not present

## 2017-11-17 DIAGNOSIS — E1142 Type 2 diabetes mellitus with diabetic polyneuropathy: Secondary | ICD-10-CM | POA: Diagnosis not present

## 2017-11-17 DIAGNOSIS — R609 Edema, unspecified: Secondary | ICD-10-CM | POA: Diagnosis not present

## 2017-11-17 DIAGNOSIS — I10 Essential (primary) hypertension: Secondary | ICD-10-CM | POA: Diagnosis not present

## 2017-11-27 ENCOUNTER — Other Ambulatory Visit: Payer: Self-pay | Admitting: Cardiovascular Disease

## 2017-12-03 ENCOUNTER — Telehealth: Payer: Self-pay | Admitting: Cardiovascular Disease

## 2017-12-03 MED ORDER — PRAVASTATIN SODIUM 40 MG PO TABS: ORAL_TABLET | ORAL | 2 refills | Status: DC

## 2017-12-03 NOTE — Telephone Encounter (Signed)
Pt c/o medication issue:  1. Name of Medication:   irbesartan (AVAPRO) 300 MG tablet    irbesartan (AVAPRO) 300 MG tablet Take 1 tablet (300 mg total) by mouth daily.       2. How are you currently taking this medication (dosage and times per day)? Take 1 tablet (300 mg total) by mouth daily.  And Take 1 tablet (300 mg total) by mouth daily.  3. Are you having a reaction (difficulty breathing--STAT)? no 4. What is your medication issue? What is the best time to take this medication

## 2017-12-03 NOTE — Telephone Encounter (Signed)
Heather from [facility] called to get an updated prescription sent in for the pravastatin. 20 mg every third day has been sent in.

## 2017-12-03 NOTE — Telephone Encounter (Signed)
New Message:     Brandon Herrera at [facility] clarification on how pt is supposed to take his Pravastatin please.

## 2017-12-03 NOTE — Telephone Encounter (Signed)
Received call from patient he stated after he takes Irbesartan 300 mg in morning he feels sluggish and sleepy.Stated pharmacist advised try taking at night.Advised ok to take at night.Stated his B/P has been good.Advised to call back if needed.

## 2017-12-03 NOTE — Telephone Encounter (Signed)
New Message  Pt c/o medication issue:  1. Name of Medication: pravastatin (PRAVACHOL) 40 MG tablet  2. How are you currently taking this medication (dosage and times per day)? Take 1/2 tablet every 3rd day  3. Are you having a reaction (difficulty breathing--STAT)? no  4. What is your medication issue? Pt needs clarification on the dosage. Please call

## 2017-12-03 NOTE — Telephone Encounter (Signed)
Returned call to patient no answer.Unable to leave a message no voice mail. ?

## 2017-12-03 NOTE — Telephone Encounter (Signed)
Returned the call to the patient. There was no answer and voicemail was not set up.

## 2017-12-05 ENCOUNTER — Telehealth: Payer: Self-pay | Admitting: Cardiovascular Disease

## 2017-12-05 NOTE — Telephone Encounter (Signed)
Returned call to patient, patient reports he thinks he needs to be taking his cholesterol medication more frequently.   He is currently taking pravastatin 20 mg every 3rd day.   He wants to make sure he keeps his cholesterol down with his cardiac history.  Advised this was prescribed at the frequency and dose due to his complaints/concerns of myalgias.    Patient states he has arthritis and doesn't think its due to his medication.    Advised to increase to every other day, then daily as tolerated.   Aware the goal is to take daily.   Patient agreed and will gradually increase the frequency.   He will call back when he needs new rx.

## 2017-12-05 NOTE — Telephone Encounter (Signed)
New Message:   Pt calling concerning his Pravastatin.He thinks he needs to take more Pravastatin than what he is taking.

## 2017-12-16 DIAGNOSIS — R5383 Other fatigue: Secondary | ICD-10-CM | POA: Diagnosis not present

## 2017-12-16 DIAGNOSIS — K429 Umbilical hernia without obstruction or gangrene: Secondary | ICD-10-CM | POA: Diagnosis not present

## 2017-12-16 DIAGNOSIS — R159 Full incontinence of feces: Secondary | ICD-10-CM | POA: Diagnosis not present

## 2017-12-17 DIAGNOSIS — E1342 Other specified diabetes mellitus with diabetic polyneuropathy: Secondary | ICD-10-CM | POA: Diagnosis not present

## 2017-12-17 DIAGNOSIS — I10 Essential (primary) hypertension: Secondary | ICD-10-CM | POA: Diagnosis not present

## 2017-12-29 DIAGNOSIS — Z794 Long term (current) use of insulin: Secondary | ICD-10-CM | POA: Diagnosis not present

## 2017-12-29 DIAGNOSIS — E118 Type 2 diabetes mellitus with unspecified complications: Secondary | ICD-10-CM | POA: Diagnosis not present

## 2018-01-06 ENCOUNTER — Other Ambulatory Visit: Payer: Self-pay | Admitting: Cardiovascular Disease

## 2018-01-06 ENCOUNTER — Telehealth: Payer: Self-pay | Admitting: Cardiovascular Disease

## 2018-01-06 ENCOUNTER — Other Ambulatory Visit: Payer: Self-pay | Admitting: *Deleted

## 2018-01-06 DIAGNOSIS — Z79899 Other long term (current) drug therapy: Secondary | ICD-10-CM

## 2018-01-06 NOTE — Telephone Encounter (Signed)
Returned call to patient of Dr. Claiborne Billings who complains of swelling - ankles/sock line & abdomen but he has a hernia. He feels "bloated". He has gained about 5lbs over a few weeks time. He has no SOB but is physically tired. He drinks coffee in the AM and a few beers in the evening. He used to take Maxzide QD but now takes MWF. He wonders if he needs to take this daily again.    He reports average BP   5/13 141/72 (HR 83) 5/12 147/69 (HR 77) 5/11 143/68 (HR 90) 5/10 141/69 (HR 88) 5/9  144/70 (HR 81) 5/8  136/65 (HR 80)  Routed to MD/RN for review & advice

## 2018-01-06 NOTE — Telephone Encounter (Signed)
OK to resume Maxide daily but recheck chemistry in 1 week

## 2018-01-06 NOTE — Telephone Encounter (Signed)
New message   Patient states his triamterene-hydrochlorothiazide (MAXZIDE-25) 37.5-25 MG tablet may need to be increased  Pt c/o swelling: STAT is pt has developed SOB within 24 hours  1) How much weight have you gained and in what time span? N/A  2) If swelling, where is the swelling located? ANKLES, STOMACH  3) Are you currently taking a fluid pill? YES  4) Are you currently SOB? NO  5) Do you have a log of your daily weights (if so, list)? NO  6) Have you gained 3 pounds in a day or 5 pounds in a week? N/A  7) Have you traveled recently? NO

## 2018-01-06 NOTE — Telephone Encounter (Signed)
PT RX SENT IN  ISOSORBIDE 30 MG  90 DAY 2 REFILLS

## 2018-01-07 ENCOUNTER — Telehealth: Payer: Self-pay | Admitting: Cardiovascular Disease

## 2018-01-07 DIAGNOSIS — Z79899 Other long term (current) drug therapy: Secondary | ICD-10-CM

## 2018-01-07 MED ORDER — TRIAMTERENE-HCTZ 37.5-25 MG PO TABS: 1.0000 | ORAL_TABLET | Freq: Every day | ORAL | 6 refills | Status: DC

## 2018-01-07 NOTE — Telephone Encounter (Signed)
LMTCB

## 2018-01-07 NOTE — Telephone Encounter (Signed)
New message  Pt verbalized that he is returning call for RN  He want to have the RN write him lab orders to have them done at  [facility] in [place]  He states that he do not know the street address

## 2018-01-07 NOTE — Telephone Encounter (Signed)
Spoke to patient, made aware of recommendations.   Request lab be checked at PCP next week.  Order placed and faxed to PCP.

## 2018-01-07 NOTE — Telephone Encounter (Signed)
Follow Up:    Returning Jenna's call from this morning.

## 2018-01-07 NOTE — Telephone Encounter (Signed)
Lab order changed to [facility].  Patient aware and verbalized understanding.

## 2018-01-08 ENCOUNTER — Telehealth: Payer: Self-pay | Admitting: Cardiovascular Disease

## 2018-01-08 NOTE — Telephone Encounter (Signed)
New message    Pt c/o medication issue:  1. Name of Medication: triamterene-hydrochlorothiazide (MAXZIDE-25) 37.5-25 MG tablet  2. How are you currently taking this medication (dosage and times per day)? Take 1 tablet by mouth daily.  3. Are you having a reaction (difficulty breathing--STAT)? No  4. What is your medication issue? Patient does not want to take medication , because of possible side effects. Patient concerned about being out in direct sunlight.

## 2018-01-08 NOTE — Telephone Encounter (Signed)
Spoke with Pt who states that he is concerned about taking Maxide after reading side effects. He states he is always in direct sun light and has been feeling dizzy. Informed pt to always wear sun block, hat, and stay adequately hydrated. Pt states he does not want to continue taking the medication and requesting that Dr. Claiborne Billings discontinue med or cut it in half. Pt advised to not stop medication without consulting with MD as his BP this morning was 172/68.  Pt verbalized understanding.   Routing to MD

## 2018-01-09 NOTE — Telephone Encounter (Signed)
May want to consider a evaluation in hypertension clinic to assess his meds with subsequent evaluation with me

## 2018-01-12 NOTE — Telephone Encounter (Signed)
Spoke to patient. Patient states he unable to  Come for an appointment. He does not have transportation to [employer].  Patient states he is taking medication now,and limiting salt intake. He state he will do lab work tomorrow or the next.  Patient states he is having issues with brother - stealing - he states he called [employer] traffic [employer].

## 2018-01-14 ENCOUNTER — Other Ambulatory Visit: Payer: Self-pay | Admitting: Cardiovascular Disease

## 2018-01-14 DIAGNOSIS — Z79899 Other long term (current) drug therapy: Secondary | ICD-10-CM | POA: Diagnosis not present

## 2018-01-15 ENCOUNTER — Encounter: Payer: Self-pay | Admitting: *Deleted

## 2018-01-15 LAB — BASIC METABOLIC PANEL
BUN/Creatinine Ratio: 13 (ref 10–24)
BUN: 14 mg/dL (ref 8–27)
CHLORIDE: 101 mmol/L (ref 96–106)
CO2: 26 mmol/L (ref 20–29)
Calcium: 10.2 mg/dL (ref 8.6–10.2)
Creatinine, Ser: 1.05 mg/dL (ref 0.76–1.27)
GFR calc Af Amer: 82 mL/min/{1.73_m2} (ref >59)
GFR, EST NON AFRICAN AMERICAN: 71 mL/min/{1.73_m2} (ref >59)
GLUCOSE: 176 mg/dL — AB (ref 65–99)
POTASSIUM: 4.7 mmol/L (ref 3.5–5.2)
SODIUM: 142 mmol/L (ref 134–144)

## 2018-02-03 ENCOUNTER — Other Ambulatory Visit: Payer: Self-pay

## 2018-02-09 ENCOUNTER — Telehealth: Payer: Self-pay | Admitting: Cardiovascular Disease

## 2018-02-09 NOTE — Telephone Encounter (Signed)
Returned a call to [facility] her that I will send over the necessary documentation for patient to receive supplies from them.

## 2018-02-09 NOTE — Telephone Encounter (Signed)
New Message    Freida Busman is calling from [facility] on behalf of a patient. The patient is wanting to receive his supplies from [facility] instead of [facility] for his cpap. They are needing an order. Please call.

## 2018-02-10 ENCOUNTER — Telehealth: Payer: Self-pay | Admitting: Cardiovascular Disease

## 2018-02-10 MED ORDER — PRAVASTATIN SODIUM 40 MG PO TABS: 40.0000 mg | ORAL_TABLET | Freq: Every day | ORAL | 6 refills | Status: DC

## 2018-02-10 NOTE — Telephone Encounter (Signed)
Returned call to patient-patient picked up prescription for pravastatin and the instructions states 1/2 tablet every 3rd day and he wanted to know why it has these instructions.      Advised per previous phone notes: in January this was decreased due to myalgias.   Patient doesn't recall this conversation.    Also advised of conversation in [place] instructed to increase as tolerated.   Patient also does not recall this conversation.   Advised to continue taking daily if he is tolerating okay.   New rx sent to preferred pharmacy.

## 2018-02-10 NOTE — Telephone Encounter (Signed)
Left message to call back (see telephone note 4/12)

## 2018-02-10 NOTE — Telephone Encounter (Signed)
New Message:    Pt wants to know how is he supposed to take his Pravastatin?

## 2018-02-12 ENCOUNTER — Telehealth: Payer: Self-pay | Admitting: Cardiovascular Disease

## 2018-02-12 NOTE — Telephone Encounter (Signed)
Returned call to patient. He states he is not tolerating full tablet of pravastatin 40mg  daily. He has cold feet and issues with his legs, but also has arthritis and neuropathy. Advised OK to decrease half-tablet (20mg ) daily for a few weeks and call back with update on how he is doing.

## 2018-02-12 NOTE — Telephone Encounter (Signed)
Follow up    Pt c/o medication issue:  1. Name of Medication: pravastatin (PRAVACHOL) 40 MG tablet  2. How are you currently taking this medication (dosage and times per day)?   3. Are you having a reaction (difficulty breathing--STAT)?   4. What is your medication issue? Patient is calling is because he is not sure if the medication is not agreeing with him. He says his feet are cold and tingling.

## 2018-02-13 NOTE — Telephone Encounter (Signed)
Agree 

## 2018-02-25 DIAGNOSIS — Z888 Allergy status to other drugs, medicaments and biological substances status: Secondary | ICD-10-CM | POA: Diagnosis not present

## 2018-02-25 DIAGNOSIS — M47896 Other spondylosis, lumbar region: Secondary | ICD-10-CM | POA: Diagnosis not present

## 2018-02-25 DIAGNOSIS — Z8679 Personal history of other diseases of the circulatory system: Secondary | ICD-10-CM | POA: Diagnosis not present

## 2018-02-25 DIAGNOSIS — G8911 Acute pain due to trauma: Secondary | ICD-10-CM | POA: Diagnosis not present

## 2018-02-25 DIAGNOSIS — E119 Type 2 diabetes mellitus without complications: Secondary | ICD-10-CM | POA: Diagnosis not present

## 2018-02-25 DIAGNOSIS — Z7984 Long term (current) use of oral hypoglycemic drugs: Secondary | ICD-10-CM | POA: Diagnosis not present

## 2018-02-25 DIAGNOSIS — S39012A Strain of muscle, fascia and tendon of lower back, initial encounter: Secondary | ICD-10-CM | POA: Diagnosis not present

## 2018-02-25 DIAGNOSIS — Z7902 Long term (current) use of antithrombotics/antiplatelets: Secondary | ICD-10-CM | POA: Diagnosis not present

## 2018-02-25 DIAGNOSIS — M545 Low back pain: Secondary | ICD-10-CM | POA: Diagnosis not present

## 2018-02-25 DIAGNOSIS — Z79899 Other long term (current) drug therapy: Secondary | ICD-10-CM | POA: Diagnosis not present

## 2018-02-25 DIAGNOSIS — E785 Hyperlipidemia, unspecified: Secondary | ICD-10-CM | POA: Diagnosis not present

## 2018-02-25 DIAGNOSIS — M5136 Other intervertebral disc degeneration, lumbar region: Secondary | ICD-10-CM | POA: Diagnosis not present

## 2018-02-25 DIAGNOSIS — I10 Essential (primary) hypertension: Secondary | ICD-10-CM | POA: Diagnosis not present

## 2018-02-25 DIAGNOSIS — Z794 Long term (current) use of insulin: Secondary | ICD-10-CM | POA: Diagnosis not present

## 2018-02-25 DIAGNOSIS — Z7982 Long term (current) use of aspirin: Secondary | ICD-10-CM | POA: Diagnosis not present

## 2018-02-25 DIAGNOSIS — M438X6 Other specified deforming dorsopathies, lumbar region: Secondary | ICD-10-CM | POA: Diagnosis not present

## 2018-03-03 DIAGNOSIS — E118 Type 2 diabetes mellitus with unspecified complications: Secondary | ICD-10-CM | POA: Diagnosis not present

## 2018-03-05 DIAGNOSIS — E782 Mixed hyperlipidemia: Secondary | ICD-10-CM | POA: Diagnosis not present

## 2018-03-05 DIAGNOSIS — G4733 Obstructive sleep apnea (adult) (pediatric): Secondary | ICD-10-CM | POA: Diagnosis not present

## 2018-03-05 DIAGNOSIS — I10 Essential (primary) hypertension: Secondary | ICD-10-CM | POA: Diagnosis not present

## 2018-03-05 DIAGNOSIS — E1142 Type 2 diabetes mellitus with diabetic polyneuropathy: Secondary | ICD-10-CM | POA: Diagnosis not present

## 2018-03-21 ENCOUNTER — Telehealth: Payer: Self-pay | Admitting: Physician Assistant

## 2018-03-21 DIAGNOSIS — Z79899 Other long term (current) drug therapy: Secondary | ICD-10-CM

## 2018-03-21 NOTE — Telephone Encounter (Signed)
Pt called stating he felt tired and sluggish and wanted to know if it was his cholesterol medicine. He also drinks 2-3 beers every night. He has not had LFTs since 2016. I advised him that this was likely not his cholesterol medicine, but that it would be a good idea to try to cut back his alcohol intake. He wants an appt, but says he doesn't have a car right now. I asked him to see if he could get an appt with his PCP on Monday, since he lives in [place]. His home BP readings sound stable. He expressed understanding of the plan.

## 2018-03-23 DIAGNOSIS — Z79899 Other long term (current) drug therapy: Secondary | ICD-10-CM | POA: Diagnosis not present

## 2018-03-23 NOTE — Telephone Encounter (Signed)
Spoke with pt who states he is currently at [facility] in [place] and would like to have a LFT order. Per Fabian Sharp, PA telephone encounter on 7/27 pt hasn't had one since 2016. Pt states he has car trouble and would rather have order placed while he is there. Orders placed.

## 2018-03-23 NOTE — Addendum Note (Signed)
Addended by: Meryl Crutch on: 03/23/2018 10:56 AM   Modules accepted: Orders

## 2018-03-23 NOTE — Telephone Encounter (Signed)
Follow up   Patient requesting order for LFT

## 2018-03-23 NOTE — Addendum Note (Signed)
Addended by: Meryl Crutch on: 03/23/2018 10:44 AM   Modules accepted: Orders

## 2018-03-24 ENCOUNTER — Encounter: Payer: Self-pay | Admitting: *Deleted

## 2018-03-24 LAB — HEPATIC FUNCTION PANEL
ALT: 40 IU/L (ref 0–44)
AST: 29 IU/L (ref 0–40)
Albumin: 4.4 g/dL (ref 3.5–4.8)
Alkaline Phosphatase: 61 IU/L (ref 39–117)
BILIRUBIN, DIRECT: 0.16 mg/dL (ref 0.00–0.40)
Bilirubin Total: 0.6 mg/dL (ref 0.0–1.2)
TOTAL PROTEIN: 6.7 g/dL (ref 6.0–8.5)

## 2018-03-27 ENCOUNTER — Telehealth: Payer: Self-pay | Admitting: Physician Assistant

## 2018-03-27 NOTE — Telephone Encounter (Signed)
Pt called stating he is feeling tired and sluggish and questions his BP medications. His BP has been perfectly controlled on his current regimen. We again talked about how he needed to come into the clinic so we can better assess him. He tells me repeatedly he can't because of car trouble, but he is currently (while on the phone) driving around in his car. I advised him to not change any medications as his pressure was 120/75 today. I will send a message to see if we can get him an appt in clinic with an APP or in HTN clinic.

## 2018-03-30 NOTE — Telephone Encounter (Signed)
Spoke with the pt per Brandon Herrera message and advised him that he needs to be seen but pt declined. He told me that he is having car problems and can commit to an appointment right now.. He also says that he is trying to stop drinking his 3 plus beers every night. I asked pt to please call back soon to make his follow up appt and pt agreed and says he will.

## 2018-04-08 DIAGNOSIS — G4733 Obstructive sleep apnea (adult) (pediatric): Secondary | ICD-10-CM | POA: Diagnosis not present

## 2018-04-09 NOTE — Telephone Encounter (Signed)
Will await return call from pt ./cy

## 2018-04-19 ENCOUNTER — Telehealth: Payer: Self-pay | Admitting: Physician Assistant

## 2018-04-19 NOTE — Telephone Encounter (Signed)
Patient states that he is concerned about being on both aspirin and Plavix.  He wonders if he can stop 1 of them.  He has had a couple of small wounds on his leg that bled more than he thought they should.  That is 1 of the reasons for his concern.  I explained that he has a history of coronary artery disease.  Because of this, I did not want to stop either the aspirin or the Plavix without talking to Dr. Claiborne Billings.  The patient states that I do not need to do that, he is willing to keep taking them both.  I explained to the patient that if he drinks alcohol on top of those medications, he might believe a little easier.  I asked the patient once more if he wanted me to ask Dr. Claiborne Billings about taking both aspirin and Plavix, he again said he did not.  I requested he contact us if he has additional bleeding issues, he said he would do so.  Rosaria Ferries, PA-C 04/19/2018 4:06 PM Beeper [phone]

## 2018-04-20 ENCOUNTER — Telehealth: Payer: Self-pay | Admitting: Cardiovascular Disease

## 2018-04-20 NOTE — Telephone Encounter (Signed)
° °  Patient calling to discuss BP medications. Patient states he feels extremely tired. Patient wants to know if he should stop taking one of his medications

## 2018-04-20 NOTE — Telephone Encounter (Signed)
Patient called and stated that everyday around lunch he is extremely fatigued and sleepy. He wants to always shut his eyes and has no energy. Patients BP in the morning was 148/73 HR 85, and just now he checked it and it was 169/71 HR 82. Patient does mention he is a diabetic but sugar was 162 now.   He does mention he wakes up every night at around 2:00 am in the morning and has hard time going back to sleep.   His current medications: (dosages correct on list except carvedilol ) Amlodipine daily Celebrex Plavix Maxzide-25 Novolin 70/30 Irbesartan   Carvedilol - take 1 BID  Pravastatin  He is wondering if any of these could be causing this.

## 2018-04-21 ENCOUNTER — Telehealth: Payer: Self-pay

## 2018-04-21 NOTE — Telephone Encounter (Signed)
Follow up:  Patient called yesterday would like for some one to please call back.

## 2018-04-21 NOTE — Telephone Encounter (Signed)
Spoke with pt. Pt has called the office several times over the last couple of days complaining of fatigue.  Pt denies chest pain, sob, palpitations, n/v, diaphoresis, swelling, orthopnea, weight gain or loss.  Pt sts that he is tired all the time. He is not sleeping at night, but not due to physical symptoms, pt sts that his mind is always racing. He will fall asleep but doesn't stay asleep. This causes him to be tired during the day and take day naps.  He is wondering if his medication is causing his fatigue. He does admit that he is under a lot of stress and he is anxious. He does not have any energy to do the things he would like to do. Pt denies any thought of self harm.  Recommended the pt f/u with his pcp regarding sleep disturbance and fatigue since he is not able to associate any symptoms with his fatigue.  Pt sts that he has spoke with his pcp's office and they adv him that he can come in to be seen at anytime. Adv pt that he should f/u with his pcp asap tomorrow if possible and he should bring all of his medication with him to that visit. Pt agreeable and adv pt to f/u with cardiology if indicated.

## 2018-04-21 NOTE — Telephone Encounter (Signed)
See other telephone note.  

## 2018-04-22 DIAGNOSIS — F22 Delusional disorders: Secondary | ICD-10-CM | POA: Diagnosis not present

## 2018-04-22 DIAGNOSIS — G4700 Insomnia, unspecified: Secondary | ICD-10-CM | POA: Diagnosis not present

## 2018-04-22 NOTE — Telephone Encounter (Signed)
Recommend a download of his CPAP unit to make certain he is adequately treated and that adjustments do not need to be made.  Also recommend follow-up with PCP.

## 2018-04-23 NOTE — Telephone Encounter (Signed)
Order faxed to [facility] to obtain CPAP download.

## 2018-05-02 ENCOUNTER — Other Ambulatory Visit: Payer: Self-pay | Admitting: Cardiovascular Disease

## 2018-05-05 DIAGNOSIS — F22 Delusional disorders: Secondary | ICD-10-CM | POA: Diagnosis not present

## 2018-05-05 DIAGNOSIS — G4700 Insomnia, unspecified: Secondary | ICD-10-CM | POA: Diagnosis not present

## 2018-05-19 ENCOUNTER — Other Ambulatory Visit: Payer: Self-pay

## 2018-05-19 MED ORDER — AMLODIPINE BESYLATE 10 MG PO TABS: 10.0000 mg | ORAL_TABLET | Freq: Every day | ORAL | 0 refills | Status: DC

## 2018-05-20 ENCOUNTER — Telehealth (HOSPITAL_COMMUNITY): Payer: Self-pay | Admitting: Cardiovascular Disease

## 2018-05-20 NOTE — Telephone Encounter (Signed)
Returned call to patient he stated he has been dizzy for the past 1 month.Stated he is falling frequently.No syncope.Appointment scheduled with Almyra Deforest PA 05/27/18 at 11:30 am.Advised to bring all medications.

## 2018-05-20 NOTE — Telephone Encounter (Signed)
New Message:    Patient stated his medication is making him dizzy   When asked what medication he is having issues with, he stated he is not sure.

## 2018-05-21 ENCOUNTER — Telehealth: Payer: Self-pay | Admitting: Cardiovascular Disease

## 2018-05-21 NOTE — Telephone Encounter (Signed)
Follow up:    Patient returning call back from yesterday. Please call back 

## 2018-05-21 NOTE — Telephone Encounter (Signed)
Returned call to patient, he states he was returning a call in regards to his blood sugar?   Advised if he called PCP yesterday this may have been call from them.    Patient continues to state he is extremely sleepy and something is wrong.  He states he thinks this is coming from his medications.  He states at 10am every morning he has to take multiple naps throughout the day.  He states he can hardly drive he is so sleepy, he has to pull over on the side of the road to take a nap.    He states he is wearing CPAP.   He does state his PCP gave him a medication to help him sleep at night but has not taken this in 3 days.   His CBG, BP and HR were stable per report this am.   Patient continues to ask if his medications could cause this.  Advised his cardiac medications should not be causing him to be this sleepy.   Encouraged patient many times to contact PCP to discuss and rule out other causes.  Also offered sooner appt with APP tomorrow, patient declined.    Advised patient to contact PCP to discuss and keep appt for 10/2 with APP.   Advised to NOT drive and to avoid alcohol with his sleeping medication (he states he usually drinks a beer at night and wondering if this in combination with his medication could contribute).    He states he is not taking his sleep medication at this time.   Advised to hold until contacting PCP and avoid alcohol as well as driving.

## 2018-05-26 ENCOUNTER — Encounter: Payer: Self-pay | Admitting: Physician Assistant

## 2018-05-26 ENCOUNTER — Ambulatory Visit (INDEPENDENT_AMBULATORY_CARE_PROVIDER_SITE_OTHER): Payer: PPO | Admitting: Physician Assistant

## 2018-05-26 VITALS — BP 160/84 | HR 76 | Ht 70.0 in | Wt 219.4 lb

## 2018-05-26 DIAGNOSIS — I255 Ischemic cardiomyopathy: Secondary | ICD-10-CM

## 2018-05-26 DIAGNOSIS — Z9989 Dependence on other enabling machines and devices: Secondary | ICD-10-CM

## 2018-05-26 DIAGNOSIS — E785 Hyperlipidemia, unspecified: Secondary | ICD-10-CM | POA: Diagnosis not present

## 2018-05-26 DIAGNOSIS — R5383 Other fatigue: Secondary | ICD-10-CM

## 2018-05-26 DIAGNOSIS — I10 Essential (primary) hypertension: Secondary | ICD-10-CM | POA: Diagnosis not present

## 2018-05-26 DIAGNOSIS — I2510 Atherosclerotic heart disease of native coronary artery without angina pectoris: Secondary | ICD-10-CM

## 2018-05-26 DIAGNOSIS — Z794 Long term (current) use of insulin: Secondary | ICD-10-CM | POA: Diagnosis not present

## 2018-05-26 DIAGNOSIS — G4733 Obstructive sleep apnea (adult) (pediatric): Secondary | ICD-10-CM

## 2018-05-26 DIAGNOSIS — E119 Type 2 diabetes mellitus without complications: Secondary | ICD-10-CM

## 2018-05-26 NOTE — Patient Instructions (Addendum)
Medication Instructions:  Your physician recommends that you continue on your current medications as directed. Please refer to the Current Medication list given to you today.  If you need a refill on your cardiac medications before your next appointment, please call your pharmacy.  Labwork: Your physician recommends that you return for lab work in: [place], [employer], [employer] in our office  Testing/Procedures: Your physician has requested that you have an echocardiogram. Echocardiography is a painless test that uses sound waves to create images of your heart. It provides your doctor with information about the size and shape of your heart and how well your heart's chambers and valves are working. This procedure takes approximately one hour. There are no restrictions for this procedure. [place]  Follow-Up: Your physician wants you to follow-up in: [place]. You will receive a reminder letter in the mail two months in advance. If you don't receive a letter, please call our office to schedule the follow-up appointment.  Any Other Special Instructions Will Be Listed Below (If Applicable).

## 2018-05-26 NOTE — Progress Notes (Signed)
Cardiology Office Note    Date:  05/28/2018   ID:  Brandon, Herrera 08-20-1946, MRN [id number]  PCP:  Blythe Stanford., MD  Cardiologist:  Dr. Claiborne Billings  Chief Complaint  Patient presents with  . Follow-up    seen for Dr. Claiborne Billings. Fatigue, dzziness    History of Present Illness:  Brandon Herrera is a 72 y.o. male with PMH of CAD, DM II, HTN, HLD, OSA on CPAP and prostate CA. patient had a inferior MI in 1985 due to total RCA occlusion.  He underwent PTCA of the left circumflex in 1990 and directional coronary atherectomy of LAD 1991.  He underwent intervention to the left circumflex artery, LAD in the D1 in 2005.  He had seed implantation for his prostate cancer.  In 1911, sleep study demonstrated mild sleep apnea overall.  He has been placed on CPAP since.  Echocardiogram obtained on 06/04/2016 showed his EF has improved from the previous 35-40% to 45-50%.  He has last office visit with Dr. Claiborne Billings was on 06/03/2017, he was doing well at the time.  Patient presents today for cardiology office visit with multiple complaints.  He has occasional dizziness, however the primary concern is fatigue and lack of energy.  I recommended basic lab work to rule out secondary causes for his symptoms.  Given his previous ischemic cardiomyopathy, I also recommended a echocardiogram. He says he has been compliant with his CPAP therapy on a daily basis and he has been benefiting from the therapy.  He will need to see Dr. Claiborne Billings for management of CPAP therapy.  While his primary issue is he is very nervous and has not had a very good sleep hygiene.  He says that his sleep during the day and at night he did wake up very early.  I instructed him not to drink any alcohol prior to going to bed as it will interfere with his sleep pattern.  Overall he does not have significant chest discomfort or shortness of breath.  He has no lower extremity edema, orthopnea or PND.   Past Medical History:  Diagnosis Date  . Abnormal  nuclear stress test 03/03/2012   mod size inferior scar w/new anterolateral wall ischemia towards apex  . CAD (coronary artery disease)   . Diabetes mellitus ([place])   . Heart murmur    07/28/2008 ECHO: mild mitral annular ca+,AOV mildly sclerotic,mild LVH,mod.global hypokinesis,mild to mod post wall hypokinesis, EF 35-40%,LA mildly dilated  . Hyperlipidemia   . Hypertension   . Inferior MI ([place]) 1985   totalled RCA  . OSA on CPAP   . Prostate cancer ([place])    Seed implant    Past Surgical History:  Procedure Laterality Date  . CARDIAC CATHETERIZATION  02/11/2003   patent CX stent,chronically occluded RCA  . CARDIAC CATHETERIZATION  10/29/2004   No evidence of restenosis LAD but 30-40% narrowing prox. to stent,widely patient CX, old subtotalled RCA  . CORONARY ANGIOPLASTY WITH STENT PLACEMENT  1995   CX & LAD  . CORONARY ANGIOPLASTY WITH STENT PLACEMENT  12/25/2001   LCX  . CORONARY ANGIOPLASTY WITH STENT PLACEMENT  11/07/2003   CX, planned stenting of LAD later  . CORONARY ANGIOPLASTY WITH STENT PLACEMENT  12/05/2003   LAD  . PERCUTANEOUS CORONARY ROTOBLATOR INTERVENTION (PCI-R)  1991   LAD  . PTCA  1990   CX    Current Medications: Outpatient Medications Prior to Visit  Medication Sig Dispense Refill  . acetaminophen (TYLENOL) 650 MG  CR tablet Take one and a half (1 1/2) tablet (975 mg total) by mouth each morning. Take one (1) tablet (650 mg total) by mouth with lunch and supper and at bedtime.    Marland Kitchen amLODipine (NORVASC) 10 MG tablet Take 1 tablet (10 mg total) by mouth daily. Schedule OV for refills 90 tablet 0  . aspirin 81 MG tablet Take 81 mg by mouth daily.    . Biotin 5000 MCG CAPS Take 1 capsule by mouth daily.    . carvedilol (COREG) 12.5 MG tablet Take 1.5 tablets (18.75 mg total) by mouth 2 (two) times daily. 270 tablet 3  . celecoxib (CELEBREX) 200 MG capsule Take 200 mg by mouth daily as needed.     . Cetirizine HCl 10 MG CAPS Take 1 capsule by mouth daily.    .  Cholecalciferol (VITAMIN D3) 5000 units TABS Take 5,000 Units by mouth daily.    . clopidogrel (PLAVIX) 75 MG tablet Take 1 tablet (75 mg total) by mouth daily. 30 tablet 10  . glucose blood (ONE TOUCH ULTRA TEST) test strip Inject 1 strip as directed 2 (two) times daily.    . insulin NPH-regular Human (NOVOLIN 70/30) (70-30) 100 UNIT/ML injection Inject 50 Units into the skin 2 (two) times daily.    . irbesartan (AVAPRO) 300 MG tablet Take 1 tablet (300 mg total) by mouth daily. 30 tablet 9  . isosorbide mononitrate (IMDUR) 30 MG 24 hr tablet Take 1 tablet (30 mg total) by mouth daily. 90 tablet 2  . metFORMIN (GLUCOPHAGE) 500 MG tablet Take 1,000 mg by mouth 2 (two) times daily.    . Multiple Vitamin (MULTIVITAMIN) tablet Take 1 tablet by mouth daily.    . Omega-3 Fatty Acids (KP FISH OIL) 1200 MG CAPS Take two (2) capsules by mouth each morning and one (1) capsule by mouth each evening.    . pravastatin (PRAVACHOL) 40 MG tablet Take 1 tablet (40 mg total) by mouth daily. 30 tablet 6  . traZODone (DESYREL) 50 MG tablet Take 50 mg by mouth at bedtime as needed.   1  . triamterene-hydrochlorothiazide (MAXZIDE-25) 37.5-25 MG tablet Take 1 tablet by mouth daily. 30 tablet 6  . vitamin B-12 (CYANOCOBALAMIN) 1000 MCG tablet Take 1,000 mcg by mouth daily.    Marland Kitchen zolpidem (AMBIEN CR) 6.25 MG CR tablet Take 1 tablet by mouth at bedtime.     No facility-administered medications prior to visit.      Allergies:   Ace inhibitors and Zetia [ezetimibe]   Social History   Socioeconomic History  . Marital status: Divorced    Spouse name: Not on file  . Number of children: Not on file  . Years of education: Not on file  . Highest education level: Not on file  Occupational History  . Not on file  Social Needs  . Financial resource strain: Not on file  . Food insecurity:    Worry: Not on file    Inability: Not on file  . Transportation needs:    Medical: Not on file    Non-medical: Not on file    Tobacco Use  . Smoking status: Former Research scientist (life sciences)  . Smokeless tobacco: Former Systems developer    Quit date: 08/26/1983  Substance and Sexual Activity  . Alcohol use: No  . Drug use: No  . Sexual activity: Not on file  Lifestyle  . Physical activity:    Days per week: Not on file    Minutes per session: Not on  file  . Stress: Not on file  Relationships  . Social connections:    Talks on phone: Not on file    Gets together: Not on file    Attends religious service: Not on file    Active member of club or organization: Not on file    Attends meetings of clubs or organizations: Not on file    Relationship status: Not on file  Other Topics Concern  . Not on file  Social History Narrative  . Not on file     Family History:  The patient's family history includes Heart attack in his father and mother.   ROS:   Please see the history of present illness.    ROS All other systems reviewed and are negative.   PHYSICAL EXAM:   VS:  BP (!) 160/84   Pulse 76   Ht 5\' 10"  (1.778 m)   Wt 219 lb 6.4 oz (99.5 kg)   BMI 31.48 kg/m    GEN: Well nourished, well developed, in no acute distress  HEENT: normal  Neck: no JVD, carotid bruits, or masses Cardiac: RRR; no murmurs, rubs, or gallops,no edema  Respiratory:  clear to auscultation bilaterally, normal work of breathing GI: soft, nontender, nondistended, + BS MS: no deformity or atrophy  Skin: warm and dry, no rash Neuro:  Alert and Oriented x 3, Strength and sensation are intact Psych: euthymic mood, full affect  Wt Readings from Last 3 Encounters:  05/26/18 219 lb 6.4 oz (99.5 kg)  06/03/17 220 lb 12.8 oz (100.2 kg)  11/26/16 221 lb (100.2 kg)      Studies/Labs Reviewed:   EKG:  EKG is ordered today.  The ekg ordered today demonstrates normal sinus rhythm, no significant ST-T wave changes.  Right axis deviation  Recent Labs: 03/23/2018: ALT 40 05/26/2018: BUN 21; Creatinine, Ser 0.87; Hemoglobin 14.2; Platelets 174; Potassium 4.3;  Sodium 141; TSH 1.240   Lipid Panel    Component Value Date/Time   CHOL 147 10/08/2016 0931   TRIG 92 10/08/2016 0931   HDL 52 10/08/2016 0931   CHOLHDL 2.8 10/08/2016 0931   VLDL 18 10/08/2016 0931   LDLCALC 77 10/08/2016 0931    Additional studies/ records that were reviewed today include:   Echo 06/04/2016 LV EF: 45% -   50% Study Conclusions  - Left ventricle: The cavity size was normal. Wall thickness was   normal. Systolic function was mildly reduced. The estimated   ejection fraction was in the range of 45% to 50%. Doppler   parameters are consistent with abnormal left ventricular   relaxation (grade 1 diastolic dysfunction). - Mitral valve: Mildly calcified annulus. - Left atrium: The atrium was mildly dilated.   ASSESSMENT:    1. Other fatigue   2. Ischemic cardiomyopathy   3. Coronary artery disease involving native coronary artery of native heart without angina pectoris   4. Essential hypertension   5. Hyperlipidemia, unspecified hyperlipidemia type   6. OSA on CPAP   7. Controlled type 2 diabetes mellitus without complication, with long-term current use of insulin (HCC)      PLAN:  In order of problems listed above:  1. Fatigue: I think one of his main issue is he has very poor sleep hygiene and asleep during the day and minutes to sleep at night.  I also instructed him to hold off on drinking any alcohol prior to going to sleep.  I will obtain basic lab work to rule out potential secondary causes  for his fatigue.  His dizziness if it is very minor and that mainly occur with positional changes.  His main concern today is his fatigue.  2. Ischemic cardiomyopathy: Ejection fraction improved on previous echocardiogram in 2017.  I did recommend a repeat echocardiogram  3. CAD: Denies any obvious chest pain.  Continue aspirin  4. Hypertension: Blood pressure elevated today, however normally it is well controlled.  5. Hyperlipidemia: On Pravachol.  Due for  repeat lab work  6. Obstructive sleep apnea on CPAP: He has been compliant with his CPAP therapy and has been benefiting from the therapy.  He will need to follow-up with Dr. Claiborne Billings for management of CPAP machine  7. DM2: On insulin, managed by primary care provider.    Medication Adjustments/Labs and Tests Ordered: Current medicines are reviewed at length with the patient today.  Concerns regarding medicines are outlined above.  Medication changes, Labs and Tests ordered today are listed in the Patient Instructions below. Patient Instructions  Medication Instructions:  Your physician recommends that you continue on your current medications as directed. Please refer to the Current Medication list given to you today.  If you need a refill on your cardiac medications before your next appointment, please call your pharmacy.  Labwork: Your physician recommends that you return for lab work in: [place], [employer], [employer] in our office  Testing/Procedures: Your physician has requested that you have an echocardiogram. Echocardiography is a painless test that uses sound waves to create images of your heart. It provides your doctor with information about the size and shape of your heart and how well your heart's chambers and valves are working. This procedure takes approximately one hour. There are no restrictions for this procedure. [place]  Follow-Up: Your physician wants you to follow-up in: [place]. You will receive a reminder letter in the mail two months in advance. If you don't receive a letter, please call our office to schedule the follow-up appointment.  Any Other Special Instructions Will Be Listed Below (If Applicable).          Hilbert Corrigan, [place]  05/28/2018 11:28 PM    [address], [place], [place]  [address] Phone: [phone]; Fax: [phone]

## 2018-05-27 ENCOUNTER — Ambulatory Visit: Payer: PPO | Admitting: Physician Assistant

## 2018-05-27 LAB — CBC
Hematocrit: 41.9 % (ref 37.5–51.0)
Hemoglobin: 14.2 g/dL (ref 13.0–17.7)
MCH: 31.3 pg (ref 26.6–33.0)
MCHC: 33.9 g/dL (ref 31.5–35.7)
MCV: 93 fL (ref 79–97)
Platelets: 174 10*3/uL (ref 150–450)
RBC: 4.53 x10E6/uL (ref 4.14–5.80)
RDW: 12.8 % (ref 12.3–15.4)
WBC: 7.9 10*3/uL (ref 3.4–10.8)

## 2018-05-27 LAB — BASIC METABOLIC PANEL
BUN / CREAT RATIO: 24 (ref 10–24)
BUN: 21 mg/dL (ref 8–27)
CO2: 22 mmol/L (ref 20–29)
CREATININE: 0.87 mg/dL (ref 0.76–1.27)
Calcium: 10.8 mg/dL — ABNORMAL HIGH (ref 8.6–10.2)
Chloride: 103 mmol/L (ref 96–106)
GFR calc Af Amer: 100 mL/min/{1.73_m2} (ref >59)
GFR calc non Af Amer: 86 mL/min/{1.73_m2} (ref >59)
GLUCOSE: 171 mg/dL — AB (ref 65–99)
Potassium: 4.3 mmol/L (ref 3.5–5.2)
Sodium: 141 mmol/L (ref 134–144)

## 2018-05-27 LAB — TSH: TSH: 1.24 u[IU]/mL (ref 0.450–4.500)

## 2018-05-28 ENCOUNTER — Encounter: Payer: Self-pay | Admitting: Physician Assistant

## 2018-06-01 DIAGNOSIS — G4733 Obstructive sleep apnea (adult) (pediatric): Secondary | ICD-10-CM | POA: Diagnosis not present

## 2018-06-02 DIAGNOSIS — E1342 Other specified diabetes mellitus with diabetic polyneuropathy: Secondary | ICD-10-CM | POA: Diagnosis not present

## 2018-06-03 ENCOUNTER — Other Ambulatory Visit: Payer: Self-pay | Admitting: Cardiovascular Disease

## 2018-06-08 ENCOUNTER — Encounter: Payer: Self-pay | Admitting: Cardiovascular Disease

## 2018-06-08 ENCOUNTER — Ambulatory Visit (INDEPENDENT_AMBULATORY_CARE_PROVIDER_SITE_OTHER): Payer: PPO | Admitting: Cardiovascular Disease

## 2018-06-08 VITALS — BP 138/71 | HR 77 | Ht 70.0 in | Wt 221.0 lb

## 2018-06-08 DIAGNOSIS — Z794 Long term (current) use of insulin: Secondary | ICD-10-CM | POA: Diagnosis not present

## 2018-06-08 DIAGNOSIS — E785 Hyperlipidemia, unspecified: Secondary | ICD-10-CM

## 2018-06-08 DIAGNOSIS — I255 Ischemic cardiomyopathy: Secondary | ICD-10-CM

## 2018-06-08 DIAGNOSIS — I2510 Atherosclerotic heart disease of native coronary artery without angina pectoris: Secondary | ICD-10-CM

## 2018-06-08 DIAGNOSIS — G4733 Obstructive sleep apnea (adult) (pediatric): Secondary | ICD-10-CM | POA: Diagnosis not present

## 2018-06-08 DIAGNOSIS — I10 Essential (primary) hypertension: Secondary | ICD-10-CM

## 2018-06-08 DIAGNOSIS — E1142 Type 2 diabetes mellitus with diabetic polyneuropathy: Secondary | ICD-10-CM | POA: Diagnosis not present

## 2018-06-08 DIAGNOSIS — Z9989 Dependence on other enabling machines and devices: Secondary | ICD-10-CM | POA: Diagnosis not present

## 2018-06-08 MED ORDER — ISOSORBIDE MONONITRATE ER 30 MG PO TB24: ORAL_TABLET | ORAL | 3 refills | Status: DC

## 2018-06-08 MED ORDER — AMLODIPINE BESYLATE 10 MG PO TABS: 10.0000 mg | ORAL_TABLET | Freq: Every day | ORAL | 3 refills | Status: DC

## 2018-06-08 MED ORDER — TRIAMTERENE-HCTZ 37.5-25 MG PO TABS: 1.0000 | ORAL_TABLET | ORAL | 3 refills | Status: DC

## 2018-06-08 MED ORDER — IRBESARTAN 300 MG PO TABS: 300.0000 mg | ORAL_TABLET | Freq: Every day | ORAL | 3 refills | Status: DC

## 2018-06-08 MED ORDER — CLOPIDOGREL BISULFATE 75 MG PO TABS: 75.0000 mg | ORAL_TABLET | Freq: Every day | ORAL | 3 refills | Status: DC

## 2018-06-08 MED ORDER — PRAVASTATIN SODIUM 40 MG PO TABS: 40.0000 mg | ORAL_TABLET | Freq: Every day | ORAL | 3 refills | Status: DC

## 2018-06-08 MED ORDER — CARVEDILOL 12.5 MG PO TABS: 18.7500 mg | ORAL_TABLET | Freq: Two times a day (BID) | ORAL | 3 refills | Status: DC

## 2018-06-08 NOTE — Patient Instructions (Signed)
Medication Instructions:  Your physician recommends that you continue on your current medications as directed. Please refer to the Current Medication list given to you today.  If you need a refill on your cardiac medications before your next appointment, please call your pharmacy.   Follow-Up: At [facility], you and your health needs are our priority.  As part of our continuing mission to provide you with exceptional heart care, we have created designated Provider Care Teams.  These Care Teams include your primary Cardiologist (physician) and Advanced Practice Providers (APPs -  Physician Assistants and Nurse Practitioners) who all work together to provide you with the care you need, when you need it. You will need a follow up appointment in 4 months.  Please call our office 2 months in advance to schedule this appointment.  You may see Shelva Majestic, MD or one of the following Advanced Practice Providers on your designated Care Team: [place], [place] . Fabian Sharp, PA-C   We will send orders for a new CPAP machine and new mask

## 2018-06-08 NOTE — Progress Notes (Signed)
Patient ID: DAILY CRATE, male   DOB: July 03, 1946, 72 y.o.   MRN: [id number]    HPI: Brandon Herrera is a 72 y.o. male who presents to the office today for a 12 month cardiology followup evaluation.  Brandon Herrera has CAD and suffered in an inferior myocardial infarction in 1985 due to  total RCA occlusion. In 1990 he underwent PTCA of the circumflex and in 1991 directional coronary atherectomy of an eccentric ossified LAD stenosis. He is also status post interventions to circumflex coronary artery with his last intervention in 2005 at which time he also had a intervention to the LAD and first diagonal vessel. He has a history of type 2 diabetes mellitus, hypertension, and hyperlipidemia.  He also has remote history of prostate CA and is status post prostate seed implantation  In 2011 a sleep study demonstrated mild sleep apnea overall with an AHI of 7.44/hr but sleep apnea was moderate at 16.4/hr with REM sleep. He  dropped his oxygen saturation to 88%. He has been using CPAP therapy since 2011 and notes huge difference is in his sense of well-being.  A download from March 2013 through June 2013 showed 100% usage, averaging 7 hours and 29 minutes per night. At that time, his AHI was 3.5 per hour. He presently denies any breakthrough snoring. He denies residual daytime sleepiness. He denies restless legs.  Over the past year, he has remained fairly stable from a cardiac standpoint. Specifically he denies recurrent anginal symptoms. He believes his blood pressure has been controlled.  He has been active.  He admits to compliance with his medical regimen.  Recently, however, he has noticed more difficulty in having prolonged sleep.  He continues to have significant social issues with his brother.  5 weeks ago, he was hit in the head by a bull calf and sustained some mild head trauma.  He has noticed some floaters in his right eye.  Due to his fatigability, a download was obtained from his CPAP unit.  This revealed  excellent compliance with 100% of days used with him, averaging 8 hours and 46 minutes of sleep per night.  On 10 cm set pressure, AHI was 5.5, mainly due to and hypopnea index of 4.0, with an apnea index of 1.5.  He did not have any leak with his mask.   An echo Doppler study on 06/04/2016 revealed improvement of his LV function which was now 45-50%, improved from 35-40%.  There was grade 1 diastolic dysfunction and mild left atrial enlargement.    He  uses CPAP with 100% compliance.  A compliance report from 10/20/2016 through 11/18/2016 showed100% usage days and 100% usage greater than 4 hours.  He is averaging 8 hours and 4 minutes of sleep per night.  At a 12 cm set pressure, apnea index was 2.1, hypopnea index 4.5, an AHI 6.6.  There was no leak.  [facility] and  suggested getting one of the newer wireless units.  I recommended the ResMed AirSense 10 unit.   I last saw him in October 2018 at which time he was doing well from a cardiac standpoint.  He was continuing to use CPAP for his OSA.    Over the past year, he denies any recurrent anginal symptomatology.  He has had recent issues with his CPAP mask and believes the DME company has been sending him the wrong cushion.  He uses CPAP with 100% compliance but at  times continues to be sleepy.  He admits to fatigue.  He denies palpitations.  His CPAP machine is an Publishing copy from June 2012.  He saw Brandon Herrera on May 26, 2018.  Time his major complaint was that of fatigue and lack of energy.  He denied any edema PND orthopnea.  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  He presents for evaluation.  Past Medical History:  Diagnosis Date  . Abnormal nuclear stress test 03/03/2012   mod size inferior scar w/new anterolateral wall ischemia towards apex  . CAD (coronary artery disease)   . Diabetes mellitus ([place])   . Heart murmur    07/28/2008 ECHO: mild mitral annular ca+,AOV mildly sclerotic,mild  LVH,mod.global hypokinesis,mild to mod post wall hypokinesis, EF 35-40%,LA mildly dilated  . Hyperlipidemia   . Hypertension   . Inferior MI ([place]) 1985   totalled RCA  . OSA on CPAP   . Prostate cancer ([place])    Seed implant    Past Surgical History:  Procedure Laterality Date  . CARDIAC CATHETERIZATION  02/11/2003   patent CX stent,chronically occluded RCA  . CARDIAC CATHETERIZATION  10/29/2004   No evidence of restenosis LAD but 30-40% narrowing prox. to stent,widely patient CX, old subtotalled RCA  . CORONARY ANGIOPLASTY WITH STENT PLACEMENT  1995   CX & LAD  . CORONARY ANGIOPLASTY WITH STENT PLACEMENT  12/25/2001   LCX  . CORONARY ANGIOPLASTY WITH STENT PLACEMENT  11/07/2003   CX, planned stenting of LAD later  . CORONARY ANGIOPLASTY WITH STENT PLACEMENT  12/05/2003   LAD  . PERCUTANEOUS CORONARY ROTOBLATOR INTERVENTION (PCI-R)  1991   LAD  . PTCA  1990   CX    Allergies  Allergen Reactions  . Ace Inhibitors Cough    Cough with Benazepril   . Zetia [Ezetimibe] Other (See Comments)    myalgias    Current Outpatient Medications  Medication Sig Dispense Refill  . acetaminophen (TYLENOL) 650 MG CR tablet Take one and a half (1 1/2) tablet (975 mg total) by mouth each morning. Take one (1) tablet (650 mg total) by mouth with lunch and supper and at bedtime.    Marland Kitchen amLODipine (NORVASC) 10 MG tablet Take 1 tablet (10 mg total) by mouth daily. 90 tablet 3  . aspirin 81 MG tablet Take 81 mg by mouth daily.    . Biotin 5000 MCG CAPS Take 1 capsule by mouth daily.    . carvedilol (COREG) 12.5 MG tablet Take 1.5 tablets (18.75 mg total) by mouth 2 (two) times daily. 270 tablet 3  . celecoxib (CELEBREX) 200 MG capsule Take 200 mg by mouth daily as needed.     . Cetirizine HCl 10 MG CAPS Take 1 capsule by mouth daily.    . Cholecalciferol (VITAMIN D3) 5000 units TABS Take 5,000 Units by mouth daily.    . clopidogrel (PLAVIX) 75 MG tablet Take 1 tablet (75 mg total) by mouth daily. 90  tablet 3  . glucose blood (ONE TOUCH ULTRA TEST) test strip Inject 1 strip as directed 2 (two) times daily.    . insulin NPH-regular Human (NOVOLIN 70/30) (70-30) 100 UNIT/ML injection Inject 50 Units into the skin 2 (two) times daily.    . irbesartan (AVAPRO) 300 MG tablet Take 1 tablet (300 mg total) by mouth daily. 90 tablet 3  . isosorbide mononitrate (IMDUR) 30 MG 24 hr tablet Take 1 tablet (30 mg total) by mouth daily. 90 tablet 3  . metFORMIN (GLUCOPHAGE)  500 MG tablet Take 1,000 mg by mouth 2 (two) times daily.    . Multiple Vitamin (MULTIVITAMIN) tablet Take 1 tablet by mouth daily.    . Omega-3 Fatty Acids (KP FISH OIL) 1200 MG CAPS Take two (2) capsules by mouth each morning and one (1) capsule by mouth each evening.    . pravastatin (PRAVACHOL) 40 MG tablet Take 1 tablet (40 mg total) by mouth daily. 90 tablet 3  . traZODone (DESYREL) 50 MG tablet Take 50 mg by mouth at bedtime as needed.   1  . triamterene-hydrochlorothiazide (MAXZIDE-25) 37.5-25 MG tablet Take 1 tablet by mouth every other day. 90 tablet 3  . vitamin B-12 (CYANOCOBALAMIN) 1000 MCG tablet Take 1,000 mcg by mouth daily.     No current facility-administered medications for this visit.     SOCHX is notable that he is divorced. He does not have children he did have family issues in the past particularly with his brother. This seems to be improving. There is no tobacco or alcohol use.  ROS General: Positive for fatigue; No fevers, chills, or night sweats; HEENT: Negative; No changes in vision or hearing, sinus congestion, difficulty swallowing Pulmonary: Negative; No cough, wheezing, shortness of breath, hemoptysis Cardiovascular: See history of present illness No chest pain, presyncope, syncope, palpitations GI: Negative; No nausea, vomiting, diarrhea, or abdominal pain GU: Negative; No dysuria, hematuria, or difficulty voiding Musculoskeletal: Negative; no myalgias, joint pain, or weakness Hematologic/Oncology:  Negative; no easy bruising, bleeding Endocrine: Negative; no heat/cold intolerance; no diabetes Neuro: Negative; no changes in balance, headaches Skin: Negative; No rashes or skin lesions Psychiatric: Negative; No behavioral problems, depression Sleep: Positive for obstructive sleep apnea on CPAP therapy with 100% compliance; No snoring, daytime sleepiness, hypersomnolence, bruxism, restless legs, hypnogognic hallucinations, no cataplexy Other comprehensive 14 point system review is negative.  PE BP 138/71   Pulse 77   Ht '5\' 10"'  (1.778 m)   Wt 221 lb (100.2 kg)   BMI 31.71 kg/m    Repeat blood pressure by me was 134/70  Wt Readings from Last 3 Encounters:  06/08/18 221 lb (100.2 kg)  05/26/18 219 lb 6.4 oz (99.5 kg)  06/03/17 220 lb 12.8 oz (100.2 kg)   General: Alert, oriented, no distress.  Skin: normal turgor, no rashes, warm and dry HEENT: Normocephalic, atraumatic. Pupils equal round and reactive to light; sclera anicteric; extraocular muscles intact;  Nose without nasal septal hypertrophy Mouth/Parynx benign; Mallinpatti scale 3 Neck: No JVD, no carotid bruits; normal carotid upstroke Lungs: clear to ausculatation and percussion; no wheezing or rales Chest wall: without tenderness to palpitation Heart: PMI not displaced, RRR, s1 s2 normal, 1/6 systolic murmur, no diastolic murmur, no rubs, gallops, thrills, or heaves Abdomen: soft, nontender; no hepatosplenomehaly, BS+; abdominal aorta nontender and not dilated by palpation. Back: no CVA tenderness Pulses 2+ Musculoskeletal: full range of motion, normal strength, no joint deformities Extremities: no clubbing cyanosis or edema, Homan's sign negative  Neurologic: grossly nonfocal; Cranial nerves grossly wnl Psychologic: Normal mood and affect  ECG (independently read by me): Sinus rhythm with isolated PVC.  Early transition.  Nonspecific T wave abnormality.  October 2018 ECG (independently read by me): Normal sinus  rhythm at 76 bpm.  Nonspecific T changes.  Normal intervals.  No ectopy.  April 2018 ECG (independently read by me): Normal sinus rhythm at 74 bpm.  Small nondiagnostic inferior Q waves.  Early transition.  Nonspecific T changes.  October 2017 ECG (independently read by me): Normal sinus  rhythm with mild sinus arrhythmia at 85 bpm.  Q waves in leads III and F.  Previously noted T-wave abnormality inferolaterally.  ECG (independently interpreted by me): Normal sinus rhythm at 66 bpm.  Early transition.  Nonspecific T-wave changes V4 through V6 and in leads 2 and aVF.  ECG (independently interpreted by me): Normal sinus rhythm. Nonspecific T changes. Normal intervals.   LABS:  BMP Latest Ref Rng & Units 05/26/2018 01/14/2018 05/02/2016  Glucose 65 - 99 mg/dL 171(H) 176(H) 209(H)  BUN 8 - 27 mg/dL '21 14 19  ' Creatinine 0.76 - 1.27 mg/dL 0.87 1.05 1.17  BUN/Creat Ratio 10 - '24 24 13 ' -  Sodium 134 - 144 mmol/L 141 142 139  Potassium 3.5 - 5.2 mmol/L 4.3 4.7 4.5  Chloride 96 - 106 mmol/L 103 101 104  CO2 20 - 29 mmol/L '22 26 21  ' Calcium 8.6 - 10.2 mg/dL 10.8(H) 10.2 10.0   Hepatic Function Latest Ref Rng & Units 03/23/2018 04/11/2015  Total Protein 6.0 - 8.5 g/dL 6.7 6.3  Albumin 3.5 - 4.8 g/dL 4.4 4.1  AST 0 - 40 IU/L 29 27  ALT 0 - 44 IU/L 40 34  Alk Phosphatase 39 - 117 IU/L 61 59  Total Bilirubin 0.0 - 1.2 mg/dL 0.6 0.5  Bilirubin, Direct 0.00 - 0.40 mg/dL 0.16 -   CBC Latest Ref Rng & Units 05/26/2018 05/02/2016 04/11/2015  WBC 3.4 - 10.8 x10E3/uL 7.9 7.3 7.4  Hemoglobin 13.0 - 17.7 g/dL 14.2 13.1(L) 14.8  Hematocrit 37.5 - 51.0 % 41.9 39.4 43.3  Platelets 150 - 450 x10E3/uL 174 195 179   Lab Results  Component Value Date   MCV 93 05/26/2018   MCV 93.4 05/02/2016   MCV 91.2 04/11/2015   Lab Results  Component Value Date   TSH 1.240 05/26/2018   Lab Results  Component Value Date   HGBA1C 7.2 (H) 04/11/2015   Lipid Panel     Component Value Date/Time   CHOL 147 10/08/2016  0931   TRIG 92 10/08/2016 0931   HDL 52 10/08/2016 0931   CHOLHDL 2.8 10/08/2016 0931   VLDL 18 10/08/2016 0931   LDLCALC 77 10/08/2016 0931    RADIOLOGY: No results found.  IMPRESSION:  1. OSA on CPAP   2. Coronary artery disease involving native coronary artery of native heart without angina pectoris   3. Ischemic cardiomyopathy   4. Type 2 diabetes mellitus with diabetic polyneuropathy, with long-term current use of insulin ([place])   5. Essential hypertension   6. Hyperlipidemia with target LDL less than 70     ASSESSMENT AND PLAN: Brandon Herrera is a 72 year old Caucasian male who has established coronary artery disease documented for 34 years when he presented with his inferior wall myocardial infarction in 1985 was found to have total RCA occlusion. He also is status post remote PTCA of the circumflex in 1990.  In 1991 he underwent DCA of an eccentrically calcified LAD stenosis. His last intervention in 2005 was done to his LAD and first diagonal vessel which was stented. He has continued to be stable without recurrent anginal symptomatology.  He continues to be on amlodipine 10 mg, carvedilol 18.75 mg twice a day, isosorbide 30 mg daily and Erbe Sartain 300 mg daily.  His blood pressure today is relatively stable on the above treatment.  He I have kept him on dual antiplatelet therapy which she has tolerated and denies bleeding.  He is on pravastatin for hyperlipidemia.  His last fasting lipid  panel was in February 2018 which was revealed an LDL of 77.  He also has been taking Maxide every other day for some intermittent leg swelling.  There is no edema today.  His CPAP machine is old.  He has been having significant difficulty with fatigability.  He admits to 100% compliance.  I believe he will qualify for a new machine I have recommended he be switched to a ResMed air sense 10 CPAP unit.  He has an old Art therapist full facemask.  I have suggested a ResMed air fit F 30 which may be more  comfortable for him to evaluate and try.  I reviewed his chemistry profile from October 1.  Renal function was stable with a creatinine of 0.87.  AST in July 2019 was 40.  I have recommended follow-up lipid studies.  I would recommend changing him to a more potent statin if LDL cholesterol remains greater than 70.  He is diabetic on metformin and insulin. I will see him in 3 -4  months for follow-up evaluation   Time spent: 25 minutes Troy Sine, MD, [facility]  06/10/2018 5:58 PM

## 2018-06-09 ENCOUNTER — Other Ambulatory Visit: Payer: Self-pay | Admitting: Cardiovascular Disease

## 2018-06-09 DIAGNOSIS — G4700 Insomnia, unspecified: Secondary | ICD-10-CM | POA: Diagnosis not present

## 2018-06-09 DIAGNOSIS — G4733 Obstructive sleep apnea (adult) (pediatric): Secondary | ICD-10-CM

## 2018-06-09 DIAGNOSIS — Z794 Long term (current) use of insulin: Secondary | ICD-10-CM | POA: Diagnosis not present

## 2018-06-09 DIAGNOSIS — Z9989 Dependence on other enabling machines and devices: Principal | ICD-10-CM

## 2018-06-09 DIAGNOSIS — E782 Mixed hyperlipidemia: Secondary | ICD-10-CM | POA: Diagnosis not present

## 2018-06-09 DIAGNOSIS — E1142 Type 2 diabetes mellitus with diabetic polyneuropathy: Secondary | ICD-10-CM | POA: Diagnosis not present

## 2018-06-09 DIAGNOSIS — E1129 Type 2 diabetes mellitus with other diabetic kidney complication: Secondary | ICD-10-CM | POA: Diagnosis not present

## 2018-06-09 DIAGNOSIS — R809 Proteinuria, unspecified: Secondary | ICD-10-CM | POA: Diagnosis not present

## 2018-06-09 DIAGNOSIS — I10 Essential (primary) hypertension: Secondary | ICD-10-CM | POA: Diagnosis not present

## 2018-06-09 DIAGNOSIS — L819 Disorder of pigmentation, unspecified: Secondary | ICD-10-CM | POA: Diagnosis not present

## 2018-06-10 ENCOUNTER — Encounter: Payer: Self-pay | Admitting: Cardiovascular Disease

## 2018-06-15 ENCOUNTER — Telehealth: Payer: Self-pay | Admitting: Cardiovascular Disease

## 2018-06-15 MED ORDER — CARVEDILOL 25 MG PO TABS: 25.0000 mg | ORAL_TABLET | Freq: Two times a day (BID) | ORAL | 3 refills | Status: DC

## 2018-06-15 NOTE — Telephone Encounter (Signed)
  Pt c/o medication issue:  1. Name of Medication: carvedilol (COREG) 12.5 MG tablet  2. How are you currently taking this medication (dosage and times per day)? Take 1.5 tablets (18.75 mg total) by mouth 2 (two) times daily  3. Are you having a reaction (difficulty breathing--STAT)? No  4. What is your medication issue? Wants to know why prescription was changed.

## 2018-06-15 NOTE — Telephone Encounter (Signed)
Spoke to pharmacy tech.  Per tech . Patient  Does not and will not take the medication as prescribed carvedilol 18.75 mg twice a day . Per pharmacy tech  Patient states he can not split medication and does not halve apill cutter and will not purchase one. Patient had been on 25 mg twice a day previously.  RN sent message to Dr Claiborne Billings. Per Dr Claiborne Billings, MAY SWITCH BACK TO 25 MG  TWICE A DAY   RN spoke to pharmacy tech. - order given to  Change to carvedilol 25 mg twice a day - new prescription e-sent. Pharmacy tech will informed patient to take (2)12.5 mg tablets total 25 mg - twice a day until bottle is empty. Then pick new prescription.

## 2018-06-26 DIAGNOSIS — J40 Bronchitis, not specified as acute or chronic: Secondary | ICD-10-CM | POA: Diagnosis not present

## 2018-06-26 DIAGNOSIS — J320 Chronic maxillary sinusitis: Secondary | ICD-10-CM | POA: Diagnosis not present

## 2018-06-26 DIAGNOSIS — R062 Wheezing: Secondary | ICD-10-CM | POA: Diagnosis not present

## 2018-08-04 ENCOUNTER — Telehealth: Payer: Self-pay | Admitting: *Deleted

## 2018-08-04 NOTE — Telephone Encounter (Signed)
Patient was on 18.75 mg twice a day but apparently was having difficulty in cutting the pills and consequently he was later increased to 25 twice daily for medication simplification.

## 2018-08-04 NOTE — Telephone Encounter (Signed)
Patient calling to verify Carvedilol dosage. Would like for the nurse to call his pharmacy and discuss with them his recent changes in medication. I explained he is to take two 12.5mg  tablets twice daily, to equal 25mg  twice daily.  He verbally repeated the correct dosing and would also like a call back from the nurse.

## 2018-09-02 ENCOUNTER — Telehealth: Payer: Self-pay | Admitting: Cardiovascular Disease

## 2018-09-02 NOTE — Telephone Encounter (Signed)
New Message   Patient is calling because he was advised that he may need a new CPAP machine. Apparently he spoke with the DME provider ([facility]) and they told him that he needed to come in for a class on how to use the new machine. He wants to know is the class worth taking for the machine because if he needs to do that then he may just keep his old one. Please call to discuss.

## 2018-09-02 NOTE — Telephone Encounter (Signed)
Returned a call to patient. He has concerns about getting the new CPAP machine that was ordered by Dr Claiborne Billings in October. His main concern is that it will be more trouble than his current machine. I assured him the new machine will be LESS trouble. He will not need to bring it to his appointments, nor bring in a chip for downloads. The maintenance is pretty much the same. After our conversation, the patient has agreed to try to get the new machine.

## 2018-09-02 NOTE — Telephone Encounter (Signed)
Patient states that he was told by [facility] that he had to take a 3 hour class before using his new machine and he did not want to do that. Also patient states if this machine is something he has to clean every 2-3 days, he did not want to do that. Patient also states that if this is going to cost him more money he did not want to do that either. I advised with patient the last OV note from [facility] that he thought he would need it to be more comfortable. Patient would like to speak with Mariann Laster, sleep coordinator. Will route to her.

## 2018-09-10 DIAGNOSIS — I10 Essential (primary) hypertension: Secondary | ICD-10-CM | POA: Diagnosis not present

## 2018-09-10 DIAGNOSIS — E1142 Type 2 diabetes mellitus with diabetic polyneuropathy: Secondary | ICD-10-CM | POA: Diagnosis not present

## 2018-09-10 DIAGNOSIS — Z794 Long term (current) use of insulin: Secondary | ICD-10-CM | POA: Diagnosis not present

## 2018-09-15 DIAGNOSIS — Z794 Long term (current) use of insulin: Secondary | ICD-10-CM | POA: Diagnosis not present

## 2018-09-15 DIAGNOSIS — R809 Proteinuria, unspecified: Secondary | ICD-10-CM | POA: Diagnosis not present

## 2018-09-15 DIAGNOSIS — D171 Benign lipomatous neoplasm of skin and subcutaneous tissue of trunk: Secondary | ICD-10-CM | POA: Diagnosis not present

## 2018-09-15 DIAGNOSIS — E1129 Type 2 diabetes mellitus with other diabetic kidney complication: Secondary | ICD-10-CM | POA: Diagnosis not present

## 2018-09-15 DIAGNOSIS — I10 Essential (primary) hypertension: Secondary | ICD-10-CM | POA: Diagnosis not present

## 2018-09-15 DIAGNOSIS — E782 Mixed hyperlipidemia: Secondary | ICD-10-CM | POA: Diagnosis not present

## 2018-09-30 ENCOUNTER — Telehealth: Payer: Self-pay | Admitting: Cardiovascular Disease

## 2018-09-30 NOTE — Telephone Encounter (Signed)
New Message:    Patient calling he has some problems with his machine CPAP. Please call patient back.

## 2018-10-01 NOTE — Telephone Encounter (Signed)
Left message call is being returned. He is to call back if assistance is still needed. My direct phone line # provided to him.

## 2018-10-02 ENCOUNTER — Telehealth: Payer: Self-pay | Admitting: *Deleted

## 2018-10-02 NOTE — Telephone Encounter (Signed)
-----   Message from Weyman Pedro sent at 10/02/2018 12:07 PM EST ----- Regarding: CPAP MACHINE Patient wanting you to call him about his CPAP machine.

## 2018-10-02 NOTE — Telephone Encounter (Signed)
Returned a call to thee patient to see if I can assist him. He states that he needs his new machine "or something. I need to get some sleep." He tells me that [facility] said he will have to pay $236 for the machine that Dr Claiborne Billings ordered. He asked me if he needed the new machine. He states Dr Claiborne Billings told him he does. I told him that if he didn't, then Dr Claiborne Billings would not have ordered it. Also he is telling me that he is not sleeping. He has a older machine and eventually it will stop working altogether. During the call he kept swaying from the conversation at hand,complaining about how "crazy" his family members are. He then states that he just needs some help whether it's changing MDE companies or changing doctors. I explained to him that it is not the doctor. He has done his job by recommending and ordering him a new CPAP machine. It doesn't matter what MDE company he uses, his insurance benefits won't change and he will still have to pay the amount quoted by [facility]. The patient then states that "this lady" told him to talk to our office about someone getting him assistance with paying the $236. I told the patient that to my knowledge there is no such assistance especially since he has insurance. The amount that he is being quoted is a leftover balance that the insurance does not cover. It may be a co pay or the fact that his deductible hasn't been met. I don't know, but which ever MDE company he chooses he will have to pay that amount. I recommended that he ask [facility] if they can do monthly payments for the $236 and he said they told him they could. I told him then that's the only solution for him if he feels his old machine is not helping him to sleep. He states that he will contact [facility] back next week to set up a time to get the new machine.

## 2018-10-21 ENCOUNTER — Encounter: Payer: Self-pay | Admitting: Cardiovascular Disease

## 2018-10-21 ENCOUNTER — Ambulatory Visit: Payer: PPO | Admitting: Cardiovascular Disease

## 2018-10-21 VITALS — BP 124/70 | HR 78 | Ht 70.0 in | Wt 223.8 lb

## 2018-10-21 DIAGNOSIS — I10 Essential (primary) hypertension: Secondary | ICD-10-CM | POA: Diagnosis not present

## 2018-10-21 DIAGNOSIS — G4733 Obstructive sleep apnea (adult) (pediatric): Secondary | ICD-10-CM | POA: Diagnosis not present

## 2018-10-21 DIAGNOSIS — Z794 Long term (current) use of insulin: Secondary | ICD-10-CM | POA: Diagnosis not present

## 2018-10-21 DIAGNOSIS — Z9989 Dependence on other enabling machines and devices: Secondary | ICD-10-CM

## 2018-10-21 DIAGNOSIS — I2510 Atherosclerotic heart disease of native coronary artery without angina pectoris: Secondary | ICD-10-CM

## 2018-10-21 DIAGNOSIS — E785 Hyperlipidemia, unspecified: Secondary | ICD-10-CM | POA: Diagnosis not present

## 2018-10-21 DIAGNOSIS — E1142 Type 2 diabetes mellitus with diabetic polyneuropathy: Secondary | ICD-10-CM | POA: Diagnosis not present

## 2018-10-21 DIAGNOSIS — R5383 Other fatigue: Secondary | ICD-10-CM | POA: Diagnosis not present

## 2018-10-21 NOTE — Progress Notes (Signed)
Patient ID: Brandon Herrera, male   DOB: 1945/12/01, 73 y.o.   MRN: [id number]    HPI: Brandon Herrera is a 73 y.o. male who presents to the office today for a 4 month cardiology followup evaluation.  Brandon Herrera has CAD and suffered in an inferior myocardial infarction in 1985 due to  total RCA occlusion. In 1990 he underwent PTCA of the circumflex and in 1991 directional coronary atherectomy of an eccentric ossified LAD stenosis. He is also status post interventions to circumflex coronary artery with his last intervention in 2005 at which time he also had a intervention to the LAD and first diagonal vessel. He has a history of type 2 diabetes mellitus, hypertension, and hyperlipidemia.  He also has remote history of prostate CA and is status post prostate seed implantation  In 2011 a sleep study demonstrated mild sleep apnea overall with an AHI of 7.44/hr but sleep apnea was moderate at 16.4/hr with REM sleep. He  dropped his oxygen saturation to 88%. He has been using CPAP therapy since 2011 and notes huge difference is in his sense of well-being.  A download from March 2013 through June 2013 showed 100% usage, averaging 7 hours and 29 minutes per night. At that time, his AHI was 3.5 per hour. He presently denies any breakthrough snoring. He denies residual daytime sleepiness. He denies restless legs.  Over the past year, he has remained fairly stable from a cardiac standpoint. Specifically he denies recurrent anginal symptoms. He believes his blood pressure has been controlled.  He has been active.  He admits to compliance with his medical regimen.  Recently, however, he has noticed more difficulty in having prolonged sleep.  He continues to have significant social issues with his brother.  5 weeks ago, he was hit in the head by a bull calf and sustained some mild head trauma.  He has noticed some floaters in his right eye.  Due to his fatigability, a download was obtained from his CPAP unit.  This revealed  excellent compliance with 100% of days used with him, averaging 8 hours and 46 minutes of sleep per night.  On 10 cm set pressure, AHI was 5.5, mainly due to and hypopnea index of 4.0, with an apnea index of 1.5.  He did not have any leak with his mask.   An echo Doppler study on 06/04/2016 revealed improvement of his LV function which was now 45-50%, improved from 35-40%.  There was grade 1 diastolic dysfunction and mild left atrial enlargement.    He  uses CPAP with 100% compliance.  A compliance report from 10/20/2016 through 11/18/2016 showed100% usage days and 100% usage greater than 4 hours.  He is averaging 8 hours and 4 minutes of sleep per night.  At a 12 cm set pressure, apnea index was 2.1, hypopnea index 4.5, an AHI 6.6.  There was no leak.  [facility] and  suggested getting one of the newer wireless units.  I recommended the ResMed AirSense 10 unit.   I last saw him in October 2018 at which time he was doing well from a cardiac standpoint.  He was continuing to use CPAP for his OSA.    I saw him in October 2019 and at that time he presented for one-year evaluation He denied any recurrent anginal symptomatology.  He has had recent issues with his CPAP mask and believes the DME company has been sending him the  wrong cushion.  He uses CPAP with 100% compliance but at times continues to be sleepy.  He admits to fatigue.  He denies palpitations.  His CPAP machine is an Publishing copy from June 2012.  He saw Almyra Deforest on May 26, 2018.  Time his major complaint was that of fatigue and lack of energy.  He denied any edema PND orthopnea.  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  He discussed at that time that his CPAP machine was very old and with his significant difficulty with fatigability suggested that he may be a candidate to receive a new ResMed AirSense 10 CPAP unit.  Not yet received his new unit but he tells me yesterday he [facility].  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  He denies any recurrent anginal symptomatology on his medical regimen.  He presents for evaluation  Past Medical History:  Diagnosis Date  . Abnormal nuclear stress test 03/03/2012   mod size inferior scar w/new anterolateral wall ischemia towards apex  . CAD (coronary artery disease)   . Diabetes mellitus ([place])   . Heart murmur    07/28/2008 ECHO: mild mitral annular ca+,AOV mildly sclerotic,mild LVH,mod.global hypokinesis,mild to mod post wall hypokinesis, EF 35-40%,LA mildly dilated  . Hyperlipidemia   . Hypertension   . Inferior MI ([place]) 1985   totalled RCA  . OSA on CPAP   . Prostate cancer ([place])    Seed implant    Past Surgical History:  Procedure Laterality Date  . CARDIAC CATHETERIZATION  02/11/2003   patent CX stent,chronically occluded RCA  . CARDIAC CATHETERIZATION  10/29/2004   No evidence of restenosis LAD but 30-40% narrowing prox. to stent,widely patient CX, old subtotalled RCA  . CORONARY ANGIOPLASTY WITH STENT PLACEMENT  1995   CX & LAD  . CORONARY ANGIOPLASTY WITH STENT PLACEMENT  12/25/2001   LCX  . CORONARY ANGIOPLASTY WITH STENT PLACEMENT  11/07/2003   CX, planned stenting of LAD later  . CORONARY ANGIOPLASTY WITH STENT PLACEMENT  12/05/2003   LAD  . PERCUTANEOUS CORONARY ROTOBLATOR INTERVENTION (PCI-R)  1991   LAD  . PTCA  1990   CX    Allergies  Allergen Reactions  . Ace Inhibitors Cough    Cough with Benazepril   . Zetia [Ezetimibe] Other (See Comments)    myalgias    Current Outpatient Medications  Medication Sig Dispense Refill  . acetaminophen (TYLENOL) 650 MG CR tablet Take one and a half (1 1/2) tablet (975 mg total) by mouth each morning. Take one (1) tablet (650 mg total) by mouth with lunch and supper and at bedtime.    Marland Kitchen amLODipine (NORVASC) 10 MG tablet Take 1 tablet (10 mg total) by mouth daily. 90 tablet 3  . aspirin 81 MG tablet Take 81 mg by mouth  daily.    . Biotin 5000 MCG CAPS Take 1 capsule by mouth daily.    . carvedilol (COREG) 25 MG tablet Take 1 tablet (25 mg total) by mouth 2 (two) times daily. 180 tablet 3  . Cetirizine HCl 10 MG CAPS Take 1 capsule by mouth daily.    . Cholecalciferol (VITAMIN D3) 5000 units TABS Take 5,000 Units by mouth daily.    . clopidogrel (PLAVIX) 75 MG tablet Take 1 tablet (75 mg total) by mouth daily. 90 tablet 3  . glucose blood (ONE TOUCH ULTRA TEST) test strip Inject 1 strip  as directed 2 (two) times daily.    . insulin NPH-regular Human (NOVOLIN 70/30) (70-30) 100 UNIT/ML injection Inject 50 Units into the skin 2 (two) times daily.    . irbesartan (AVAPRO) 300 MG tablet Take 1 tablet (300 mg total) by mouth daily. 90 tablet 3  . isosorbide mononitrate (IMDUR) 30 MG 24 hr tablet Take 1 tablet (30 mg total) by mouth daily. 90 tablet 3  . meloxicam (MOBIC) 7.5 MG tablet Take 7.5 mg by mouth daily.    . metFORMIN (GLUCOPHAGE) 500 MG tablet Take 1,000 mg by mouth 2 (two) times daily.    . Multiple Vitamin (MULTIVITAMIN) tablet Take 1 tablet by mouth daily.    . Omega-3 Fatty Acids (KP FISH OIL) 1200 MG CAPS Take two (2) capsules by mouth each morning and one (1) capsule by mouth each evening.    . pravastatin (PRAVACHOL) 40 MG tablet Take 1 tablet (40 mg total) by mouth daily. 90 tablet 3  . traZODone (DESYREL) 50 MG tablet Take 50 mg by mouth at bedtime as needed.   1  . triamterene-hydrochlorothiazide (MAXZIDE-25) 37.5-25 MG tablet Take 1 tablet by mouth every other day. 90 tablet 3  . vitamin B-12 (CYANOCOBALAMIN) 1000 MCG tablet Take 1,000 mcg by mouth daily.     No current facility-administered medications for this visit.     SOCHX is notable that he is divorced. He does not have children he did have family issues in the past particularly with his brother. This seems to be improving. There is no tobacco or alcohol use.  ROS General: Positive for fatigue; No fevers, chills, or night  sweats; HEENT: Negative; No changes in vision or hearing, sinus congestion, difficulty swallowing Pulmonary: Negative; No cough, wheezing, shortness of breath, hemoptysis Cardiovascular: See history of present illness No chest pain, presyncope, syncope, palpitations GI: Negative; No nausea, vomiting, diarrhea, or abdominal pain GU: Negative; No dysuria, hematuria, or difficulty voiding Musculoskeletal: Negative; no myalgias, joint pain, or weakness Hematologic/Oncology: Negative; no easy bruising, bleeding Endocrine: Negative; no heat/cold intolerance; no diabetes Neuro: Negative; no changes in balance, headaches Skin: Negative; No rashes or skin lesions Psychiatric: Negative; No behavioral problems, depression Sleep: Positive for obstructive sleep apnea on CPAP therapy with 100% compliance; No snoring, daytime sleepiness, hypersomnolence, bruxism, restless legs, hypnogognic hallucinations, no cataplexy Other comprehensive 14 point system review is negative.  PE BP 124/70   Pulse 78   Ht '5\' 10"'  (1.778 m)   Wt 223 lb 12.8 oz (101.5 kg)   BMI 32.11 kg/m    Repeat blood pressure by me was 128/70  Wt Readings from Last 3 Encounters:  10/21/18 223 lb 12.8 oz (101.5 kg)  06/08/18 221 lb (100.2 kg)  05/26/18 219 lb 6.4 oz (99.5 kg)   General: Alert, oriented, no distress.  Skin: normal turgor, no rashes, warm and dry HEENT: Normocephalic, atraumatic. Pupils equal round and reactive to light; sclera anicteric; extraocular muscles intact;  Nose without nasal septal hypertrophy Mouth/Parynx benign; Mallinpatti scale 3 Neck: No JVD, no carotid bruits; normal carotid upstroke Lungs: clear to ausculatation and percussion; no wheezing or rales Chest wall: without tenderness to palpitation Heart: PMI not displaced, RRR, s1 s2 normal, 1/6 systolic murmur, no diastolic murmur, no rubs, gallops, thrills, or heaves Abdomen: soft, nontender; no hepatosplenomehaly, BS+; abdominal aorta nontender  and not dilated by palpation. Back: no CVA tenderness Pulses 2+ Musculoskeletal: full range of motion, normal strength, no joint deformities Extremities: no clubbing cyanosis or edema, Homan's sign negative  Neurologic: grossly nonfocal; Cranial nerves grossly wnl Psychologic: Normal mood and affect   ECG (independently read by me): .NSR 78;Inferior Qwave iii, aVF  October 2019 ECG (independently read by me): Sinus rhythm with isolated PVC.  Early transition.  Nonspecific T wave abnormality.  October 2018 ECG (independently read by me): Normal sinus rhythm at 76 bpm.  Nonspecific T changes.  Normal intervals.  No ectopy.  April 2018 ECG (independently read by me): Normal sinus rhythm at 74 bpm.  Small nondiagnostic inferior Q waves.  Early transition.  Nonspecific T changes.  October 2017 ECG (independently read by me): Normal sinus rhythm with mild sinus arrhythmia at 85 bpm.  Q waves in leads III and F.  Previously noted T-wave abnormality inferolaterally.  ECG (independently interpreted by me): Normal sinus rhythm at 66 bpm.  Early transition.  Nonspecific T-wave changes V4 through V6 and in leads 2 and aVF.  ECG (independently interpreted by me): Normal sinus rhythm. Nonspecific T changes. Normal intervals.   LABS:  BMP Latest Ref Rng & Units 05/26/2018 01/14/2018 05/02/2016  Glucose 65 - 99 mg/dL 171(H) 176(H) 209(H)  BUN 8 - 27 mg/dL '21 14 19  ' Creatinine 0.76 - 1.27 mg/dL 0.87 1.05 1.17  BUN/Creat Ratio 10 - '24 24 13 ' -  Sodium 134 - 144 mmol/L 141 142 139  Potassium 3.5 - 5.2 mmol/L 4.3 4.7 4.5  Chloride 96 - 106 mmol/L 103 101 104  CO2 20 - 29 mmol/L '22 26 21  ' Calcium 8.6 - 10.2 mg/dL 10.8(H) 10.2 10.0   Hepatic Function Latest Ref Rng & Units 03/23/2018 04/11/2015  Total Protein 6.0 - 8.5 g/dL 6.7 6.3  Albumin 3.5 - 4.8 g/dL 4.4 4.1  AST 0 - 40 IU/L 29 27  ALT 0 - 44 IU/L 40 34  Alk Phosphatase 39 - 117 IU/L 61 59  Total Bilirubin 0.0 - 1.2 mg/dL 0.6 0.5  Bilirubin,  Direct 0.00 - 0.40 mg/dL 0.16 -   CBC Latest Ref Rng & Units 05/26/2018 05/02/2016 04/11/2015  WBC 3.4 - 10.8 x10E3/uL 7.9 7.3 7.4  Hemoglobin 13.0 - 17.7 g/dL 14.2 13.1(L) 14.8  Hematocrit 37.5 - 51.0 % 41.9 39.4 43.3  Platelets 150 - 450 x10E3/uL 174 195 179   Lab Results  Component Value Date   MCV 93 05/26/2018   MCV 93.4 05/02/2016   MCV 91.2 04/11/2015   Lab Results  Component Value Date   TSH 1.240 05/26/2018   Lab Results  Component Value Date   HGBA1C 7.2 (H) 04/11/2015   Lipid Panel     Component Value Date/Time   CHOL 147 10/08/2016 0931   TRIG 92 10/08/2016 0931   HDL 52 10/08/2016 0931   CHOLHDL 2.8 10/08/2016 0931   VLDL 18 10/08/2016 0931   LDLCALC 77 10/08/2016 0931    RADIOLOGY: No results found.  IMPRESSION:  1. Essential hypertension   2. Coronary artery disease involving native coronary artery of native heart without angina pectoris   3. OSA on CPAP   4. Hyperlipidemia with target LDL less than 70   5. Other fatigue   6. Type 2 diabetes mellitus with diabetic polyneuropathy, with long-term current use of insulin (HCC)     ASSESSMENT AND PLAN: Mr. Crevier is a 73 year old Caucasian male who has established coronary artery disease documented for 35 years when he presented with his inferior wall myocardial infarction in 1985 and was found to have total RCA occlusion. He also is status post remote PTCA of the circumflex  in 1990.  In 1991 he underwent DCA of an eccentrically calcified LAD stenosis. His last intervention in 2005 was done to his LAD and first diagonal vessel which was stented. He has continued to be stable without recurrent anginal symptomatology on his medical regimen consisting of amlodipine 10 mg, isosorbide 30 mg, carvedilol 25 mg twice a day, irbesartan 300 mg daily, and Maxide.  On the above therapy his blood pressure today is stable at 128/70.  He continues to have difficulty with family members.  He continues to use CPAP.  He admits to  fatigue.  He just paid a deposit for his new CPAP machine which she has not yet received.  He has not had recent laboratory.  He continues to be on pravastatin 40 mg daily with target LDL less than 70 for hyperlipidemia.  He is diabetic on insulin.  He continues to be on dual antiplatelet therapy with aspirin and Plavix and is tolerating this well without bleeding.  He also is on vitamin D replacement as well as vitamin B12.  Once he gets his new CPAP machine I will see him several months for follow-up evaluation.   Time spent: 25 minutes Troy Sine, MD, [facility]  10/23/2018 6:27 PM

## 2018-10-21 NOTE — Patient Instructions (Signed)

## 2018-10-23 ENCOUNTER — Encounter: Payer: Self-pay | Admitting: Cardiovascular Disease

## 2018-11-02 ENCOUNTER — Telehealth: Payer: Self-pay | Admitting: Cardiovascular Disease

## 2018-11-02 NOTE — Telephone Encounter (Signed)
New Message   Pt is calling because he said he was suppose to get a new CPAP machine. He said he doesn't want a new one and will keep the old. He said he paid for the other machine and he would like a refund.  Pt also said he needs cushions for his old machine.   Please call back

## 2018-11-02 NOTE — Telephone Encounter (Signed)
Pt called he wanted a refund on his CPAP, he did not want the new CPAP, pt was told by [facility] that if he didn't keep water in it that it would burn his house down, pt stated that [facility] would not give him any more new cushions for his old CPAP, pt had me on the line when he went into the home health store and asked for cushions for his CPAP they did not have any there but they ordered him some.

## 2018-11-03 DIAGNOSIS — M1990 Unspecified osteoarthritis, unspecified site: Secondary | ICD-10-CM | POA: Diagnosis not present

## 2018-11-05 DIAGNOSIS — G4733 Obstructive sleep apnea (adult) (pediatric): Secondary | ICD-10-CM | POA: Diagnosis not present

## 2018-11-16 ENCOUNTER — Other Ambulatory Visit: Payer: Self-pay

## 2018-11-16 DIAGNOSIS — Z6831 Body mass index (BMI) 31.0-31.9, adult: Secondary | ICD-10-CM | POA: Diagnosis not present

## 2018-11-16 DIAGNOSIS — S8991XA Unspecified injury of right lower leg, initial encounter: Secondary | ICD-10-CM | POA: Diagnosis not present

## 2018-11-16 NOTE — Patient Outreach (Signed)
[facility]) Care Management  11/16/2018  KAIZEN IBSEN Jun 29, 1946 [id number]  Nurse Call Line Referral Date: 11/16/18 Reason for Referral:  Joint pain in knee Nurse call line recommendation: see primary MD within 3 days  Attempt #1  Telephone call to patient regarding nurse call line referral. Unable to reach patient. HIPAA compliant voice message left with call back phone number.   PLAN: RNCM will attempt 2nd telephone call to patient within 4 business days. RNCM will send outreach letter.   Quinn Plowman RN,BSN, [facility] Telephonic  [phone]

## 2018-11-17 ENCOUNTER — Other Ambulatory Visit: Payer: Self-pay

## 2018-11-17 NOTE — Patient Outreach (Signed)
[facility]) Care Management  11/17/2018  Brandon Herrera February 23, 1946 [id number]  Nurse Call Line Referral Date: 11/16/18 Reason for Referral:  Joint pain in knee Nurse call line recommendation: see primary MD within 3 days   Telephone call to patient regarding nurse call line referral.  Patient would not verify HIPAA. Patient states he does not know who he is talking to and will not not give out any information. Patient states, " I just don't know who I am talking to anymore.  I am getting so many calls so please call and talk to someone else."   PLAN: RNCM will close patient due to refusal of services.   Quinn Plowman RN,BSN,CCM [facility] Telephonic  [phone]

## 2018-11-18 DIAGNOSIS — M25561 Pain in right knee: Secondary | ICD-10-CM | POA: Diagnosis not present

## 2018-11-18 DIAGNOSIS — M1712 Unilateral primary osteoarthritis, left knee: Secondary | ICD-10-CM | POA: Diagnosis not present

## 2018-11-18 DIAGNOSIS — M1711 Unilateral primary osteoarthritis, right knee: Secondary | ICD-10-CM | POA: Diagnosis not present

## 2018-11-30 ENCOUNTER — Telehealth: Payer: Self-pay | Admitting: Cardiovascular Disease

## 2018-11-30 NOTE — Telephone Encounter (Signed)
New Message:   patient calling concering some medication issues and would like for a nurse to call him. Patient states he not feeling well.

## 2018-11-30 NOTE — Telephone Encounter (Signed)
Reports feeling palpitations today. C/O chest pressure/pain past 2 days rated 2-3/10. No active chest pain. Denies sob or dizziness. Patient has taken his blood pressure and heart rate has been good. Patient concerned that his celebrex or triamterene may be causing his symptoms. Advised that these two medications should not be causing these symptoms. Patient advised that all NSAIDS can increase the risk of cardiac event and its advised that celebrex is used very minimal or not at all. Offered virtual visit either phone or video but patient declined. Advised patient that if his symptoms get worse, that he should go to the ED for an evaluation. Advised if he changes his mind about the virtual visit, to contact our office back. Verbalized understanding of plan.

## 2018-12-01 NOTE — Telephone Encounter (Signed)
agree

## 2018-12-02 ENCOUNTER — Telehealth: Payer: Self-pay | Admitting: Cardiovascular Disease

## 2018-12-02 NOTE — Telephone Encounter (Signed)
Called patient, advised of message from PharmD, he asked about taking and old medication Gabapentin, I advised with patient not to take old medication - and if he wanted a new RX he should go to PCP for that. Patient verbalized understanding.

## 2018-12-02 NOTE — Telephone Encounter (Signed)
Please advise of message?  Thanks!

## 2018-12-02 NOTE — Telephone Encounter (Signed)
New Message:    Pt says he is taking Celebrex because he fell and hurt his knee? First, he wants to know if it is alright for him to take Celebrex with his condition?  If so, can he increase it, because his knee is hurting him a lot.

## 2018-12-02 NOTE — Telephone Encounter (Signed)
Okay to take Celebrex as prescribe for short period of time .   Not recommended to increase dose. If pain unchanged or worsen after few doses, please contact prescriber for additional assessment.  Try using ice for swollen area and warm compress for muscle pain.

## 2018-12-10 DIAGNOSIS — M1711 Unilateral primary osteoarthritis, right knee: Secondary | ICD-10-CM | POA: Diagnosis not present

## 2018-12-10 DIAGNOSIS — G629 Polyneuropathy, unspecified: Secondary | ICD-10-CM | POA: Diagnosis not present

## 2018-12-14 ENCOUNTER — Telehealth: Payer: Self-pay | Admitting: Cardiovascular Disease

## 2018-12-14 NOTE — Telephone Encounter (Signed)
What medication does the patient need?

## 2018-12-14 NOTE — Telephone Encounter (Signed)
°*  STAT* If patient is at the pharmacy, call can be transferred to refill team.   1. Which medications need to be refilled? (please list name of each medication and dose if known)   2. Which pharmacy/location (including street and city if local pharmacy) is medication to be sent to?[facility] [place]   3. Do they need a 30 day or 90 day supply? 90 patient states he dose not know what medications he need refill. Please call patient.

## 2018-12-15 ENCOUNTER — Telehealth: Payer: Self-pay

## 2018-12-15 MED ORDER — CELECOXIB 100 MG PO CAPS: 100.0000 mg | ORAL_CAPSULE | Freq: Two times a day (BID) | ORAL | 1 refills | Status: DC

## 2018-12-15 MED ORDER — METFORMIN HCL 500 MG PO TABS: 1000.0000 mg | ORAL_TABLET | Freq: Two times a day (BID) | ORAL | 1 refills | Status: DC

## 2018-12-15 NOTE — Telephone Encounter (Signed)
Refills sent to [place] [facility] per pt request.

## 2018-12-17 ENCOUNTER — Telehealth: Payer: Self-pay | Admitting: Cardiovascular Disease

## 2018-12-17 NOTE — Telephone Encounter (Signed)
New Message   Patient is calling because he needs to know the pressure for his CPAP machine. He has a new cushion and thinks the pressure may be incorrect.

## 2018-12-18 DIAGNOSIS — M25661 Stiffness of right knee, not elsewhere classified: Secondary | ICD-10-CM | POA: Diagnosis not present

## 2018-12-18 DIAGNOSIS — R262 Difficulty in walking, not elsewhere classified: Secondary | ICD-10-CM | POA: Diagnosis not present

## 2018-12-18 DIAGNOSIS — M62551 Muscle wasting and atrophy, not elsewhere classified, right thigh: Secondary | ICD-10-CM | POA: Diagnosis not present

## 2018-12-18 DIAGNOSIS — M25561 Pain in right knee: Secondary | ICD-10-CM | POA: Diagnosis not present

## 2018-12-21 DIAGNOSIS — M25661 Stiffness of right knee, not elsewhere classified: Secondary | ICD-10-CM | POA: Diagnosis not present

## 2018-12-21 DIAGNOSIS — R262 Difficulty in walking, not elsewhere classified: Secondary | ICD-10-CM | POA: Diagnosis not present

## 2018-12-21 DIAGNOSIS — M25561 Pain in right knee: Secondary | ICD-10-CM | POA: Diagnosis not present

## 2018-12-21 DIAGNOSIS — M62551 Muscle wasting and atrophy, not elsewhere classified, right thigh: Secondary | ICD-10-CM | POA: Diagnosis not present

## 2018-12-23 DIAGNOSIS — M25561 Pain in right knee: Secondary | ICD-10-CM | POA: Diagnosis not present

## 2018-12-23 DIAGNOSIS — R262 Difficulty in walking, not elsewhere classified: Secondary | ICD-10-CM | POA: Diagnosis not present

## 2018-12-23 DIAGNOSIS — M62551 Muscle wasting and atrophy, not elsewhere classified, right thigh: Secondary | ICD-10-CM | POA: Diagnosis not present

## 2018-12-23 DIAGNOSIS — M25661 Stiffness of right knee, not elsewhere classified: Secondary | ICD-10-CM | POA: Diagnosis not present

## 2018-12-24 NOTE — Telephone Encounter (Addendum)
° °  Patient calling to report issue with cpap mask. Please call   1) What problem are you experiencing? Patient has concerns about pressure flow.  2) Who is your medical equipment company? [employer]   Please route to the sleep study assistant.

## 2018-12-25 DIAGNOSIS — R262 Difficulty in walking, not elsewhere classified: Secondary | ICD-10-CM | POA: Diagnosis not present

## 2018-12-25 DIAGNOSIS — M25661 Stiffness of right knee, not elsewhere classified: Secondary | ICD-10-CM | POA: Diagnosis not present

## 2018-12-25 DIAGNOSIS — M62551 Muscle wasting and atrophy, not elsewhere classified, right thigh: Secondary | ICD-10-CM | POA: Diagnosis not present

## 2018-12-25 DIAGNOSIS — M25561 Pain in right knee: Secondary | ICD-10-CM | POA: Diagnosis not present

## 2018-12-28 DIAGNOSIS — M25561 Pain in right knee: Secondary | ICD-10-CM | POA: Diagnosis not present

## 2018-12-28 DIAGNOSIS — M25661 Stiffness of right knee, not elsewhere classified: Secondary | ICD-10-CM | POA: Diagnosis not present

## 2018-12-28 DIAGNOSIS — M62551 Muscle wasting and atrophy, not elsewhere classified, right thigh: Secondary | ICD-10-CM | POA: Diagnosis not present

## 2018-12-28 DIAGNOSIS — R262 Difficulty in walking, not elsewhere classified: Secondary | ICD-10-CM | POA: Diagnosis not present

## 2018-12-29 DIAGNOSIS — E1142 Type 2 diabetes mellitus with diabetic polyneuropathy: Secondary | ICD-10-CM | POA: Diagnosis not present

## 2018-12-29 DIAGNOSIS — I10 Essential (primary) hypertension: Secondary | ICD-10-CM | POA: Diagnosis not present

## 2018-12-29 DIAGNOSIS — Z794 Long term (current) use of insulin: Secondary | ICD-10-CM | POA: Diagnosis not present

## 2018-12-30 DIAGNOSIS — M62551 Muscle wasting and atrophy, not elsewhere classified, right thigh: Secondary | ICD-10-CM | POA: Diagnosis not present

## 2018-12-30 DIAGNOSIS — M25561 Pain in right knee: Secondary | ICD-10-CM | POA: Diagnosis not present

## 2018-12-30 DIAGNOSIS — R262 Difficulty in walking, not elsewhere classified: Secondary | ICD-10-CM | POA: Diagnosis not present

## 2018-12-30 DIAGNOSIS — M25661 Stiffness of right knee, not elsewhere classified: Secondary | ICD-10-CM | POA: Diagnosis not present

## 2018-12-31 DIAGNOSIS — I10 Essential (primary) hypertension: Secondary | ICD-10-CM | POA: Diagnosis not present

## 2018-12-31 DIAGNOSIS — Z794 Long term (current) use of insulin: Secondary | ICD-10-CM | POA: Diagnosis not present

## 2018-12-31 DIAGNOSIS — E559 Vitamin D deficiency, unspecified: Secondary | ICD-10-CM | POA: Diagnosis not present

## 2018-12-31 DIAGNOSIS — E1142 Type 2 diabetes mellitus with diabetic polyneuropathy: Secondary | ICD-10-CM | POA: Diagnosis not present

## 2018-12-31 DIAGNOSIS — E785 Hyperlipidemia, unspecified: Secondary | ICD-10-CM | POA: Diagnosis not present

## 2019-01-01 DIAGNOSIS — M62551 Muscle wasting and atrophy, not elsewhere classified, right thigh: Secondary | ICD-10-CM | POA: Diagnosis not present

## 2019-01-01 DIAGNOSIS — R262 Difficulty in walking, not elsewhere classified: Secondary | ICD-10-CM | POA: Diagnosis not present

## 2019-01-01 DIAGNOSIS — M25561 Pain in right knee: Secondary | ICD-10-CM | POA: Diagnosis not present

## 2019-01-01 DIAGNOSIS — M25661 Stiffness of right knee, not elsewhere classified: Secondary | ICD-10-CM | POA: Diagnosis not present

## 2019-01-04 DIAGNOSIS — M62551 Muscle wasting and atrophy, not elsewhere classified, right thigh: Secondary | ICD-10-CM | POA: Diagnosis not present

## 2019-01-04 DIAGNOSIS — R262 Difficulty in walking, not elsewhere classified: Secondary | ICD-10-CM | POA: Diagnosis not present

## 2019-01-04 DIAGNOSIS — M25661 Stiffness of right knee, not elsewhere classified: Secondary | ICD-10-CM | POA: Diagnosis not present

## 2019-01-04 DIAGNOSIS — M25561 Pain in right knee: Secondary | ICD-10-CM | POA: Diagnosis not present

## 2019-01-06 DIAGNOSIS — M62551 Muscle wasting and atrophy, not elsewhere classified, right thigh: Secondary | ICD-10-CM | POA: Diagnosis not present

## 2019-01-06 DIAGNOSIS — R262 Difficulty in walking, not elsewhere classified: Secondary | ICD-10-CM | POA: Diagnosis not present

## 2019-01-06 DIAGNOSIS — M25561 Pain in right knee: Secondary | ICD-10-CM | POA: Diagnosis not present

## 2019-01-06 DIAGNOSIS — M25661 Stiffness of right knee, not elsewhere classified: Secondary | ICD-10-CM | POA: Diagnosis not present

## 2019-01-08 DIAGNOSIS — M25661 Stiffness of right knee, not elsewhere classified: Secondary | ICD-10-CM | POA: Diagnosis not present

## 2019-01-08 DIAGNOSIS — M25561 Pain in right knee: Secondary | ICD-10-CM | POA: Diagnosis not present

## 2019-01-08 DIAGNOSIS — M62551 Muscle wasting and atrophy, not elsewhere classified, right thigh: Secondary | ICD-10-CM | POA: Diagnosis not present

## 2019-01-08 DIAGNOSIS — R262 Difficulty in walking, not elsewhere classified: Secondary | ICD-10-CM | POA: Diagnosis not present

## 2019-01-11 DIAGNOSIS — M25661 Stiffness of right knee, not elsewhere classified: Secondary | ICD-10-CM | POA: Diagnosis not present

## 2019-01-11 DIAGNOSIS — R262 Difficulty in walking, not elsewhere classified: Secondary | ICD-10-CM | POA: Diagnosis not present

## 2019-01-11 DIAGNOSIS — M25561 Pain in right knee: Secondary | ICD-10-CM | POA: Diagnosis not present

## 2019-01-11 DIAGNOSIS — M62551 Muscle wasting and atrophy, not elsewhere classified, right thigh: Secondary | ICD-10-CM | POA: Diagnosis not present

## 2019-01-13 ENCOUNTER — Other Ambulatory Visit: Payer: Self-pay

## 2019-01-13 DIAGNOSIS — M62551 Muscle wasting and atrophy, not elsewhere classified, right thigh: Secondary | ICD-10-CM | POA: Diagnosis not present

## 2019-01-13 DIAGNOSIS — R262 Difficulty in walking, not elsewhere classified: Secondary | ICD-10-CM | POA: Diagnosis not present

## 2019-01-13 DIAGNOSIS — G629 Polyneuropathy, unspecified: Secondary | ICD-10-CM | POA: Diagnosis not present

## 2019-01-13 DIAGNOSIS — M1712 Unilateral primary osteoarthritis, left knee: Secondary | ICD-10-CM | POA: Diagnosis not present

## 2019-01-13 DIAGNOSIS — M25561 Pain in right knee: Secondary | ICD-10-CM | POA: Diagnosis not present

## 2019-01-13 DIAGNOSIS — M1711 Unilateral primary osteoarthritis, right knee: Secondary | ICD-10-CM | POA: Diagnosis not present

## 2019-01-13 DIAGNOSIS — M25661 Stiffness of right knee, not elsewhere classified: Secondary | ICD-10-CM | POA: Diagnosis not present

## 2019-01-13 NOTE — Patient Outreach (Addendum)
[facility]) Care Management  01/13/2019  Brandon Herrera May 26, 1946 [id number]   TELEPHONE SCREENING Referral date: 01/11/18 Referral source: primary MD referral Referral reason: medication assistance.  Insurance: [employer]  Telephone call to patient regarding primary MD referral. HIPAA verified with patient. RNCM introduced herself and explained reason for call. Patient states he is currently in the donut hole and is not able to afford his insulin. Patient reports a 3 month supply will cost him $500.  RNCM advised patient to contact his primary MD for samples.  Patient states he feels some of this medications makes him sleepy and dizzy. Patient states he fell several weeks ago. He reports he hurt his leg from the fall and informed his doctor. Patient states he is currently receiving outpatient physical therapy.  Patient states he had a follow up visit with the orthopedic doctor today. He states he has " bad knees"  He reports he had a cortisone shot in the past for his knee. Patient states he is unsure how much it is helping exactly.  Patient states he takes Neurontin for his legs.  He states he thinks the medication gives him " bad dreams."  RNCM discussed and offered [facility] care management services to patient. Patient verbally agreed to follow up with [facility].  ASSESSMENT: PHQ2 - 2,  PHQ 9 - 6  PLAN: RNCM will refer patient to pharmacy.   Quinn Plowman RN,BSN,CCM [facility] Telephonic  [phone]

## 2019-01-14 ENCOUNTER — Telehealth: Payer: Self-pay | Admitting: Pharmacist

## 2019-01-14 NOTE — Patient Outreach (Signed)
[facility]) Care Management  01/14/2019  Brandon Herrera [birth year] [id number]   Patient was called regarding medication assistance with his insulin.  HIPAA identifiers were obtained. Patient asked me to call him back in 1 hour when I called him the first time because he was at [facility].  Patient was called back in an hour as requested but he did not answer the phone.  HIPAA compliant message was left on the patient's voicemail.  Plan: Call patient back in 7-10 business days due to upcoming PAL.  Elayne Guerin, PharmD, [place] Clinical Pharmacist [phone]

## 2019-01-15 ENCOUNTER — Other Ambulatory Visit: Payer: Self-pay | Admitting: Pharmacist

## 2019-01-15 DIAGNOSIS — R262 Difficulty in walking, not elsewhere classified: Secondary | ICD-10-CM | POA: Diagnosis not present

## 2019-01-15 DIAGNOSIS — M62551 Muscle wasting and atrophy, not elsewhere classified, right thigh: Secondary | ICD-10-CM | POA: Diagnosis not present

## 2019-01-15 DIAGNOSIS — M25561 Pain in right knee: Secondary | ICD-10-CM | POA: Diagnosis not present

## 2019-01-15 DIAGNOSIS — M25661 Stiffness of right knee, not elsewhere classified: Secondary | ICD-10-CM | POA: Diagnosis not present

## 2019-01-16 NOTE — Patient Outreach (Addendum)
[name]) Care Management  [name]   01/16/2019  Brandon Herrera 1946/08/16 [id number]  Reason for referral: medication assisstance  Referral source: [facility] Nurse Referral medication(s): Novolog 70/30 Current insurance:[employer]  HPI: Patient was called back regarding medication assistance. HIPAA identifiers were obtained. He is a 73 year old male with multiple medical conditions including but not limited to:  CAD, type 2 diabetes, hypertension, hyperlipidemia, and  OSA on CPAP.    Objective: Allergies  Allergen Reactions  . Ace Inhibitors Cough    Cough with Benazepril   . Zetia [Ezetimibe] Other (See Comments)    myalgias    Medications Reviewed Today    Reviewed by Troy Sine, MD (Physician) on 10/23/18 at [place] List Status: <None>  Medication Order Taking? Sig Documenting Provider Last Dose Status Informant  acetaminophen (TYLENOL) 650 MG CR tablet [id number] Yes Take one and a half (1 1/2) tablet (975 mg total) by mouth each morning. Take one (1) tablet (650 mg total) by mouth with lunch and supper and at bedtime. [provider] Taking Active   amLODipine (NORVASC) 10 MG tablet [id number] Yes Take 1 tablet (10 mg total) by mouth daily. Troy Sine, MD Taking Active   aspirin 81 MG tablet [id number] Yes Take 81 mg by mouth daily. [provider] Taking Active   Biotin 5000 MCG CAPS [id number] Yes Take 1 capsule by mouth daily. [provider] Taking Active   carvedilol (COREG) 25 MG tablet [id number] Yes Take 1 tablet (25 mg total) by mouth 2 (two) times daily. Troy Sine, MD Taking Expired 10/21/18 2359   Cetirizine HCl 10 MG CAPS [id number] Yes Take 1 capsule by mouth daily. [provider] Taking Active   Cholecalciferol (VITAMIN D3) 5000 units TABS [id number] Yes Take 5,000 Units by mouth daily. [provider] Taking Active            Med Note Shirlee Limerick, Hulen Shouts   Fri Apr 19, 2016  9:47 AM) Received from: [facility] Received Sig: Take 5,000 Units by mouth daily.  clopidogrel (PLAVIX) 75 MG tablet [id number] Yes Take 1 tablet (75 mg total) by mouth daily. Troy Sine, MD Taking Active   glucose blood (ONE TOUCH ULTRA TEST) test strip [id number] Yes Inject 1 strip as directed 2 (two) times daily. [provider] Taking Active            Med Note Cannon Kettle, Peyton Najjar   Tue Apr 11, 2015 11:48 AM) Received from: [place]: CHECK BLOOD SUGARS TWICE DAILY  insulin NPH-regular Human (NOVOLIN 70/30) (70-30) 100 UNIT/ML injection [id number] Yes Inject 50 Units into the skin 2 (two) times daily. [provider] Taking Active            Med Note (GRACE, Hulen Shouts   Fri Apr 19, 2016  9:47 AM) Received from: [place]: Inject 0.5 mLs (50 Units total) into the skin 2 (two) times daily with meals.  irbesartan (AVAPRO) 300 MG tablet [id number] Yes Take 1 tablet (300 mg total) by mouth daily. Troy Sine, MD Taking Active   isosorbide mononitrate (IMDUR) 30 MG 24 hr tablet [id number] Yes Take 1 tablet (30 mg total) by mouth daily. Troy Sine, MD Taking Active   meloxicam [facility]) 7.5 MG tablet [id number] Yes Take 7.5 mg by mouth daily. [provider] Taking Active   metFORMIN (GLUCOPHAGE) 500 MG tablet [id number] Yes  Take 1,000 mg by mouth 2 (two) times daily. [provider] Taking Active            Med Note Shirlee Limerick, Hulen Shouts   Fri Apr 19, 2016  9:47 AM) Received from: [place]: TAKE ONE TABLET TWICE A DAY  Multiple Vitamin (MULTIVITAMIN) tablet [id number] Yes Take 1 tablet by mouth daily. [provider] Taking Active   Omega-3 Fatty Acids (KP FISH OIL) 1200 MG CAPS [id number] Yes Take two (2) capsules by mouth each morning and one (1) capsule by mouth each evening. [provider] Taking Active            Med Note Shirlee Limerick, Hulen Shouts   Fri Apr 19, 2016   9:47 AM) Received from: [place]: Take by mouth.  pravastatin (PRAVACHOL) 40 MG tablet [id number] Yes Take 1 tablet (40 mg total) by mouth daily. Troy Sine, MD Taking Active   traZODone (DESYREL) 50 MG tablet [id number] Yes Take 50 mg by mouth at bedtime as needed.  [provider] Taking Active   triamterene-hydrochlorothiazide (MAXZIDE-25) 37.5-25 MG tablet [id number] Yes Take 1 tablet by mouth every other day. Troy Sine, MD Taking Active   vitamin B-12 (CYANOCOBALAMIN) 1000 MCG tablet [id number] Yes Take 1,000 mcg by mouth daily. [provider] Taking Active           Assessment:  Drugs sorted by system:  Neurologic/Psychologic: Amlodipine, Aspirin, Gabapentin,   Cardiovascular: Clopidogrel, Pravastatin, Triamterene-Hydrochlorothiazide, Carvedilol  Pulmonary/Allergy: Loratadine  Endocrine: Novolog 70/30, Metformin,   Pain: Acetaminophen, Celecoxib  Vitamins/Minerals/Supplements: Biotin, Cholecalciferol, Multiple Vitamin, Omega 3 Fatty Acids, Cyanocobalamin   Medication Review Findings:  . HgA1c-7.8% 12/29/2018 . Lipids: LDL-99 HDL- 42 TG- 145   Medication Assistance Findings:  Medication assistance needs identified: Novolog 70/30   Patient appears to meet the qualifications for the [employer] Patient Assistance Program.  Due to COVID-19, [employer] has waived the $1000 out-of-pocket medication expense requirement.  Novo Nordisk is also offering an immediate supply coupon for up to two boxes of one of their insulin products.  A free coupon was sent to the patient's pharmacy and they were able to get the prescription billed for a $0 copay.  Patient will also be sent a [employer] application via mail.    He was reminded about the financial documentation required (Social Security Benefits Letter).  Completed an Extra Help Application  Additional medication assistance options reviewed with  patient as warranted:  Coupon and Agricultural consultant  ([place] provided)    Plan: I will route patient assistance letter to [place] technician who will coordinate patient assistance program application process for medications listed above.  [facility] pharmacy technician will assist with obtaining all required documents from both patient and provider(s) and submit application(s) once completed.    Follow up with patient in 3 weeks.   Elayne Guerin, PharmD, [place] Clinical Pharmacist [phone]

## 2019-01-19 ENCOUNTER — Other Ambulatory Visit: Payer: Self-pay | Admitting: Pharmacy Technician

## 2019-01-19 DIAGNOSIS — M25561 Pain in right knee: Secondary | ICD-10-CM | POA: Diagnosis not present

## 2019-01-19 DIAGNOSIS — R262 Difficulty in walking, not elsewhere classified: Secondary | ICD-10-CM | POA: Diagnosis not present

## 2019-01-19 DIAGNOSIS — M62551 Muscle wasting and atrophy, not elsewhere classified, right thigh: Secondary | ICD-10-CM | POA: Diagnosis not present

## 2019-01-19 DIAGNOSIS — M25661 Stiffness of right knee, not elsewhere classified: Secondary | ICD-10-CM | POA: Diagnosis not present

## 2019-01-19 NOTE — Patient Outreach (Signed)
[place] [facility]) Care Management  01/19/2019  Brandon Herrera January 27, 1946 [id number]                           Medication Assistance Referral  Referral From: [place]  Medication/Company: Novolog 70/30 Flex / [employer] Patient application portion:  Education officer, museum portion: Faxed  to Dr. Delrae Alfred    Follow up:  Will follow up with patient in 5-10 business days to confirm application(s) have been received.  [name] P. Lexie Morini, [place] Management [phone]

## 2019-01-22 ENCOUNTER — Other Ambulatory Visit: Payer: Self-pay | Admitting: Pharmacist

## 2019-01-22 DIAGNOSIS — M25661 Stiffness of right knee, not elsewhere classified: Secondary | ICD-10-CM | POA: Diagnosis not present

## 2019-01-22 DIAGNOSIS — M62551 Muscle wasting and atrophy, not elsewhere classified, right thigh: Secondary | ICD-10-CM | POA: Diagnosis not present

## 2019-01-22 DIAGNOSIS — M25561 Pain in right knee: Secondary | ICD-10-CM | POA: Diagnosis not present

## 2019-01-22 DIAGNOSIS — R262 Difficulty in walking, not elsewhere classified: Secondary | ICD-10-CM | POA: Diagnosis not present

## 2019-01-22 NOTE — Patient Outreach (Signed)
[place] [facility]) Care Management  01/22/2019  Brandon Herrera [birth year] [id number]  Received a message that patient called the nurse line with medication questions. Called patient. Unfortunately, patient did not answer his phone. HIPAA compliant message was left on his voicemail.  Plan: Await a call back from the patient. Attempt to call him back later today. Call patient back in 1 business day.  Elayne Guerin, PharmD, BCACP [facility] Clinical Pharmacist [phone]   ADDENDUM  Called patient back. HIPAA identifiers were obtained. Patient's question was that he wanted to know the difference between Novolog and Novolin.  The onset of action of each insulin product was reviewed with the patient.  Patient said he had not received the patient assistance applications in the mail at the time of our call. Patient was encouraged to be "on the look out" next week for the applications.  Plan: Call patient back in 4 weeks to follow up on patient assistance applications.   Elayne Guerin, PharmD, [place] Clinical Pharmacist [phone]

## 2019-01-25 ENCOUNTER — Ambulatory Visit: Payer: Self-pay | Admitting: Pharmacist

## 2019-01-27 ENCOUNTER — Ambulatory Visit: Payer: PPO | Admitting: Pharmacist

## 2019-01-28 ENCOUNTER — Other Ambulatory Visit: Payer: Self-pay | Admitting: Pharmacist

## 2019-01-28 NOTE — Patient Outreach (Addendum)
[name]) Care Management  01/28/2019  Brandon Herrera Jan 29, 1946 [id number]   Patient called the [place] number to reach me. HIPAA identifiers were obtained. Patient explained that he had not received his Social Security Benefits Letter from [employer] yet. He reported he spoke with someone and was told they would mail the letter about a week ago.  Patient was encouraged to be patient as things have been delayed through the mail with [place]. Patient confirmed he received the applications we mailed him but has not started to complete them.  Patient was encouraged to complete the applications and mail them back with the letter received from [employer].  Plan: Follow up with the patient on Monday February 22, 2019.   Elayne Guerin, PharmD, [place] Clinical Pharmacist [phone]

## 2019-02-03 ENCOUNTER — Telehealth: Payer: Self-pay | Admitting: Pharmacist

## 2019-02-03 NOTE — Patient Outreach (Signed)
[place] [facility]) Care Management  02/03/2019  Brandon Herrera 05-Dec-1945 [id number]   Received a message the patient called [place] and requested that I call him back. Patient was called back. Unfortunately, he did not answer the phone. HIPAA compliant message was left on his voicemail.   Plan:Call patient back at his original follow up date.   Elayne Guerin, PharmD, [place] Clinical Pharmacist [phone]

## 2019-02-04 ENCOUNTER — Ambulatory Visit: Payer: PPO | Admitting: Pharmacist

## 2019-02-04 ENCOUNTER — Other Ambulatory Visit: Payer: Self-pay | Admitting: Pharmacist

## 2019-02-05 NOTE — Patient Outreach (Signed)
[place] [facility]) Care Management  02/05/2019  Brandon Herrera [birth year] [id number]   Patient called from the bank with questions about his patient assistance application. HIPAA identifiers were obtained. Patient and the bank teller (whom he gave permission for me to talk with), were walked through how to complete the application. Patient reported he received his Benefits Awards Letter from [employer] and communicated understanding on what needed to be sent back to [facility].  Plan: Await delivery of the patient's forms. Sharee Pimple Simcox will follow up. Follow up on entire process in 6-8 weeks.  Elayne Guerin, PharmD, [place] Clinical Pharmacist [phone]

## 2019-02-11 ENCOUNTER — Other Ambulatory Visit: Payer: Self-pay | Admitting: Pharmacist

## 2019-02-11 NOTE — Patient Outreach (Signed)
[facility]) Care Management  02/11/2019  Brandon Herrera 28-Jul-1946 [id number]   Received an incoming call from a representative at [employer] stating the patient was at [place] confused as to why they would not give him another insulin refill at no cost. It was explained to the representative that the patient was provided a coupon last month for two free boxes of Novolog which he received.  [facility] is in the process getting the patient approved to receive Novolog 70/30 at cost through [employer] patient assistance program.  Patient called. It was explained to the patient that the process takes about 4 weeks. He was reminded that he had already used the coupon and that when he is approved through the program, the insulin will be delivered to his provider's office and will not be dispensed through his local pharmacy.  Patient said he mailed the completed application back to the [facility] office last week.  Plan: Route note to [facility] for follow up.   Elayne Guerin, PharmD, [place] Clinical Pharmacist [phone]

## 2019-02-16 ENCOUNTER — Other Ambulatory Visit: Payer: Self-pay | Admitting: Pharmacy Technician

## 2019-02-16 ENCOUNTER — Encounter: Payer: Self-pay | Admitting: Pharmacist

## 2019-02-16 NOTE — Patient Outreach (Signed)
[place] [facility]) Care Management  02/16/2019  Brandon Herrera [birth year] [id number]    Received all necessary documents and signatures for [employer] patient assistance for Novolog 70/30 Flex.  Submitted competed application via fax.  Will followup with Novo Nordissk in 3-5 business days to inquire on status of application.  Shaquan Puerta P. Bobby Ragan, [place] Management [phone]

## 2019-02-19 ENCOUNTER — Other Ambulatory Visit: Payer: Self-pay | Admitting: Pharmacy Technician

## 2019-02-19 NOTE — Patient Outreach (Signed)
[place] [facility]) Care Management  02/19/2019  JAIS DEMIR March 29, 1946 [id number]   Care coordination call placed to [place] in regards to patient's application for Novolog 70/30 Flexpen.  Spoke to [place] who informed they would need a copy of the LIS denial letter before the application process could be completed.  Will route note to [place] for assistance.  Rakesha Dalporto P. Sativa Gelles, [place] Management [phone]

## 2019-02-22 ENCOUNTER — Ambulatory Visit: Payer: Self-pay | Admitting: Pharmacist

## 2019-02-23 ENCOUNTER — Other Ambulatory Visit: Payer: Self-pay | Admitting: Pharmacist

## 2019-02-23 NOTE — Patient Outreach (Signed)
[place] [facility]) Care Management  02/23/2019  Brandon Herrera 1945-09-14 [id number]  Patient called regarding patient assistance. HIPAA identifiers were obtained.  Patient wanted to know where he was in the process with his patient assistance application for 81/15 insulin.  Patient was informed [employer] is requiring a denial letter from [employer] about the LIS/Extra Help Program.  An Extra Help application was completed.  Patient was asked to check his mail again and see if the letter from [place] was there.    Patient called me back and said the letter was there. He was instructed to mail it back to [facility] once he receives the self addressed envelope that will be sent to him.  Once the letter is received, we will send it to [employer] to complete the patient's patient assistance process.  Plan: Route note to [employer], CPhT to send the patient a self addressed envelope. Follow up in 3-4 weeks.  Elayne Guerin, PharmD, [place] Clinical Pharmacist [phone]

## 2019-03-01 ENCOUNTER — Ambulatory Visit: Payer: Self-pay | Admitting: Pharmacist

## 2019-03-02 DIAGNOSIS — K625 Hemorrhage of anus and rectum: Secondary | ICD-10-CM | POA: Diagnosis not present

## 2019-03-04 ENCOUNTER — Other Ambulatory Visit: Payer: Self-pay | Admitting: Pharmacist

## 2019-03-04 NOTE — Patient Outreach (Signed)
[facility]) Care Management  03/04/2019  Brandon Herrera 03/15/1946 [id number]  Patient called and stated he had not received the self-addressed envelope we mailed to him last week.  HIPAA identifiers were obtained. [facility] mailed the envelope to the patient last week. It may not have been delivered at this time.    Patient said he would check his mail and give me a call back.  Novo Nordisk required a denial letter for Extra Help. Patient was denied and we sent him an envelope to send the denial letter to [facility] so we can fax it to [employer] on his behalf.  Plan: Follow up with patient at regularly scheduled follow up.   Elayne Guerin, PharmD, [place] Clinical Pharmacist [phone]

## 2019-03-08 DIAGNOSIS — H259 Unspecified age-related cataract: Secondary | ICD-10-CM | POA: Diagnosis not present

## 2019-03-08 DIAGNOSIS — H5213 Myopia, bilateral: Secondary | ICD-10-CM | POA: Diagnosis not present

## 2019-03-08 DIAGNOSIS — E119 Type 2 diabetes mellitus without complications: Secondary | ICD-10-CM | POA: Diagnosis not present

## 2019-03-08 DIAGNOSIS — Z135 Encounter for screening for eye and ear disorders: Secondary | ICD-10-CM | POA: Diagnosis not present

## 2019-03-08 DIAGNOSIS — D3130 Benign neoplasm of unspecified choroid: Secondary | ICD-10-CM | POA: Diagnosis not present

## 2019-03-09 ENCOUNTER — Other Ambulatory Visit: Payer: Self-pay | Admitting: Pharmacy Technician

## 2019-03-09 NOTE — Patient Outreach (Signed)
[place] [facility]) Care Management  03/09/2019  UBALDO DAYWALT December 04, 1945 [id number]    Received patient's PRE decisional notice from [facility] indicating patient may not be eligible for Extra Help.Normally the companies require the final notice letter.   Faxed letter to [employer] in regards to patient's application for [employer].   Will followup with Novo Nordisk in 3-7 business days to inquire on status of application.  Jemario Poitras P. Chene Kasinger, [place] Management [phone]

## 2019-03-10 DIAGNOSIS — E785 Hyperlipidemia, unspecified: Secondary | ICD-10-CM | POA: Diagnosis not present

## 2019-03-10 DIAGNOSIS — E1142 Type 2 diabetes mellitus with diabetic polyneuropathy: Secondary | ICD-10-CM | POA: Diagnosis not present

## 2019-03-10 DIAGNOSIS — I10 Essential (primary) hypertension: Secondary | ICD-10-CM | POA: Diagnosis not present

## 2019-03-10 DIAGNOSIS — I25810 Atherosclerosis of coronary artery bypass graft(s) without angina pectoris: Secondary | ICD-10-CM | POA: Diagnosis not present

## 2019-03-10 DIAGNOSIS — M1711 Unilateral primary osteoarthritis, right knee: Secondary | ICD-10-CM | POA: Diagnosis not present

## 2019-03-10 DIAGNOSIS — K625 Hemorrhage of anus and rectum: Secondary | ICD-10-CM | POA: Diagnosis not present

## 2019-03-10 DIAGNOSIS — Z794 Long term (current) use of insulin: Secondary | ICD-10-CM | POA: Diagnosis not present

## 2019-03-10 DIAGNOSIS — Z79899 Other long term (current) drug therapy: Secondary | ICD-10-CM | POA: Diagnosis not present

## 2019-03-11 ENCOUNTER — Other Ambulatory Visit: Payer: Self-pay | Admitting: Pharmacist

## 2019-03-11 ENCOUNTER — Telehealth: Payer: Self-pay | Admitting: Cardiovascular Disease

## 2019-03-11 NOTE — Telephone Encounter (Signed)
ok 

## 2019-03-11 NOTE — Patient Outreach (Signed)
[place] [facility]) Care Management  03/11/2019  Brandon Herrera [birth year] [id number]   Patient called inquire about the status of his Novolog Flex Pen patient assistance application. HIPAA identifiers were obtained.   Patient's application was sent to [employer] in June. Novo Nordisk required a rejection letter for LIS.   An online LIS application was completed on the patient's behalf. He received the denial letter and it was faxed to [employer] 03/09/2019  Called patient back. HIPAA identifiers were obtained.  Patient said he was down to his last few injection. Patient's pharmacy was called.  [facility] filled one box of Novolog 70/30 mix and said it would be $126.85 for the patient to get it today.  Patient said he would call his provider about refills.  Spoke with Susy Frizzle, she will call [employer] about the status of the patient's application.  Plan: Call patient back at his original call back in 3-4 weeks.   Elayne Guerin, PharmD, [place] Clinical Pharmacist [phone]

## 2019-03-11 NOTE — Telephone Encounter (Signed)
  Brandon Herrera would like to know if it is safe for him to take fish oil

## 2019-03-12 ENCOUNTER — Telehealth: Payer: Self-pay | Admitting: *Deleted

## 2019-03-12 ENCOUNTER — Other Ambulatory Visit: Payer: Self-pay | Admitting: Pharmacy Technician

## 2019-03-12 DIAGNOSIS — K625 Hemorrhage of anus and rectum: Secondary | ICD-10-CM | POA: Diagnosis not present

## 2019-03-12 DIAGNOSIS — Z86010 Personal history of colonic polyps: Secondary | ICD-10-CM | POA: Diagnosis not present

## 2019-03-12 NOTE — Telephone Encounter (Signed)
Dr Claiborne Billings is it OK to hold Plavix for colonoscopy in this patient- he has CAD, and DM.  Last PCI looks like it was in 2005.  Kerin Ransom PA-C 03/12/2019 1:32 PM

## 2019-03-12 NOTE — Telephone Encounter (Signed)
   [facility] Pre-operative Risk Assessment    Request for surgical clearance:  1. What type of surgery is being performed? COLONOSCOPY   2. When is this surgery scheduled? TBD  3. What type of clearance is required (medical clearance vs. Pharmacy clearance to hold med vs. Both)? PHARMACY  4. Are there any medications that need to be held prior to surgery and how long? PLAVIX   5. Practice name and name of physician performing surgery?  [facility]   6. What is your office phone number [phone]   7.   What is your office fax number [phone]  8.   Anesthesia type (None, local, MAC, general) ? CHOICE   Devra Dopp 03/12/2019, 12:53 PM  _________________________________________________________________   (provider comments below)

## 2019-03-12 NOTE — Telephone Encounter (Signed)
Left message for patient of dr [facility] recommendations.

## 2019-03-12 NOTE — Patient Outreach (Signed)
[place] [facility]) Care Management  03/12/2019  Brandon Herrera 11/08/1945 [id number]    Care coordination call placed to [name] in regards to patient's application for Novolog 70/30 Flexpen.  Spoke to [facility] who informed patient had been APPROVED 03/11/2019-11/30-2020. Medication is due to ship on MWF of next week with an arrival date to the provider's office in 10-14 business days. Patient to receive 8 boxes.  Will followup with patient in 14-21 business days to inquire if he has received the medication.  Leighton Luster P. Ceclia Koker, [place] Management [phone]

## 2019-03-15 ENCOUNTER — Telehealth: Payer: Self-pay

## 2019-03-15 NOTE — Telephone Encounter (Signed)
   [facility] Pre-operative Risk Assessment    Request for surgical clearance:  1. What type of surgery is being performed? Colonoscopy    2. When is this surgery scheduled? TBD   3. What type of clearance is required (medical clearance vs. Pharmacy clearance to hold med vs. Both)? Both  4. Are there any medications that need to be held prior to surgery and how long?  Aspirin, Plavix   5. Practice name and name of physician performing surgery? [facility]   6. What is your office phone number [phone]    7.   What is your office fax number [phone]  8.   Anesthesia type (None, local, MAC, general) ? Unknown    Ena Dawley 03/15/2019, 2:11 PM  _________________________________________________________________   (provider comments below)

## 2019-03-16 NOTE — Telephone Encounter (Signed)
   Primary Cardiologist: Shelva Majestic, MD  Chart reviewed as part of pre-operative protocol coverage. Patient was contacted 03/16/2019 in reference to pre-operative risk assessment for pending surgery as outlined below.  Brandon Herrera was last seen on 10/21/2018 by Dr. Claiborne Billings. [name] had on complaints of recurrent angina and was noted to be doing well on his current regimen.   He has been on Plavix for many years and per Dr. Claiborne Billings, it is acceptable to hold Plavix up to 5 days prior to procedure. Plavix will need to be resumed as soon as possible when cleared per procedure team.  Therefore, based on ACC/AHA guidelines, the patient would be at acceptable risk for the planned procedure without further cardiovascular testing.   I will route this recommendation to the requesting party via Epic fax function and remove from pre-op pool.  Please call with questions.  Kathyrn Drown, NP 03/16/2019, 8:34 AM

## 2019-03-16 NOTE — Telephone Encounter (Signed)
Ok to hold for at least 5 days

## 2019-03-30 DIAGNOSIS — K625 Hemorrhage of anus and rectum: Secondary | ICD-10-CM | POA: Diagnosis not present

## 2019-03-30 DIAGNOSIS — D122 Benign neoplasm of ascending colon: Secondary | ICD-10-CM | POA: Diagnosis not present

## 2019-03-31 ENCOUNTER — Other Ambulatory Visit: Payer: Self-pay | Admitting: Pharmacy Technician

## 2019-03-31 NOTE — Patient Outreach (Signed)
[facility]) Care Management  03/31/2019  Brandon Herrera August 08, 1946 [id number]   Successful outreach call placed to patient in regards to [employer] application for Novolog 70/30 Flex.  Spoke to patient, HIPAA identifiers verified.  Patient informed he received 8 boxes of the Novolog Flex 70/30. Discussed refill procedure with patient. Patient verbalized understanding. Patient informed he had no other questions or concerns. Confirmed patient had name and number for future patient assistance needs.  Will route note to [name] that patient assistance has been completed and will remove myself from the care team.  Brandon Herrera. [name], [place] Management [phone]

## 2019-04-08 DIAGNOSIS — E782 Mixed hyperlipidemia: Secondary | ICD-10-CM | POA: Diagnosis not present

## 2019-04-08 DIAGNOSIS — I25810 Atherosclerosis of coronary artery bypass graft(s) without angina pectoris: Secondary | ICD-10-CM | POA: Diagnosis not present

## 2019-04-08 DIAGNOSIS — M25561 Pain in right knee: Secondary | ICD-10-CM | POA: Diagnosis not present

## 2019-04-08 DIAGNOSIS — Z9989 Dependence on other enabling machines and devices: Secondary | ICD-10-CM | POA: Diagnosis not present

## 2019-04-08 DIAGNOSIS — I10 Essential (primary) hypertension: Secondary | ICD-10-CM | POA: Diagnosis not present

## 2019-04-08 DIAGNOSIS — M25562 Pain in left knee: Secondary | ICD-10-CM | POA: Diagnosis not present

## 2019-04-08 DIAGNOSIS — H538 Other visual disturbances: Secondary | ICD-10-CM | POA: Diagnosis not present

## 2019-04-08 DIAGNOSIS — G4733 Obstructive sleep apnea (adult) (pediatric): Secondary | ICD-10-CM | POA: Diagnosis not present

## 2019-04-08 DIAGNOSIS — E559 Vitamin D deficiency, unspecified: Secondary | ICD-10-CM | POA: Diagnosis not present

## 2019-04-08 DIAGNOSIS — Z0000 Encounter for general adult medical examination without abnormal findings: Secondary | ICD-10-CM | POA: Diagnosis not present

## 2019-04-08 DIAGNOSIS — Z125 Encounter for screening for malignant neoplasm of prostate: Secondary | ICD-10-CM | POA: Diagnosis not present

## 2019-04-08 DIAGNOSIS — E1142 Type 2 diabetes mellitus with diabetic polyneuropathy: Secondary | ICD-10-CM | POA: Diagnosis not present

## 2019-04-08 DIAGNOSIS — Z794 Long term (current) use of insulin: Secondary | ICD-10-CM | POA: Diagnosis not present

## 2019-04-15 ENCOUNTER — Ambulatory Visit: Payer: Self-pay | Admitting: Pharmacist

## 2019-04-20 DIAGNOSIS — E6609 Other obesity due to excess calories: Secondary | ICD-10-CM | POA: Diagnosis not present

## 2019-04-20 DIAGNOSIS — Z9989 Dependence on other enabling machines and devices: Secondary | ICD-10-CM | POA: Diagnosis not present

## 2019-04-20 DIAGNOSIS — I25810 Atherosclerosis of coronary artery bypass graft(s) without angina pectoris: Secondary | ICD-10-CM | POA: Diagnosis not present

## 2019-04-20 DIAGNOSIS — E7800 Pure hypercholesterolemia, unspecified: Secondary | ICD-10-CM | POA: Diagnosis not present

## 2019-04-20 DIAGNOSIS — E1142 Type 2 diabetes mellitus with diabetic polyneuropathy: Secondary | ICD-10-CM | POA: Diagnosis not present

## 2019-04-20 DIAGNOSIS — I10 Essential (primary) hypertension: Secondary | ICD-10-CM | POA: Diagnosis not present

## 2019-04-20 DIAGNOSIS — Z6832 Body mass index (BMI) 32.0-32.9, adult: Secondary | ICD-10-CM | POA: Insufficient documentation

## 2019-04-20 DIAGNOSIS — Z794 Long term (current) use of insulin: Secondary | ICD-10-CM | POA: Diagnosis not present

## 2019-04-20 DIAGNOSIS — G4733 Obstructive sleep apnea (adult) (pediatric): Secondary | ICD-10-CM | POA: Diagnosis not present

## 2019-04-29 DIAGNOSIS — G4733 Obstructive sleep apnea (adult) (pediatric): Secondary | ICD-10-CM | POA: Diagnosis not present

## 2019-06-23 ENCOUNTER — Ambulatory Visit: Payer: Self-pay | Admitting: Pharmacist

## 2019-06-23 ENCOUNTER — Other Ambulatory Visit: Payer: Self-pay | Admitting: Cardiovascular Disease

## 2019-06-23 ENCOUNTER — Other Ambulatory Visit: Payer: Self-pay | Admitting: Pharmacist

## 2019-06-23 NOTE — Patient Outreach (Signed)
[place] [facility]) Care Management  06/23/2019  Brandon Herrera July 27, 1946 [id number]   Patient was called to follow up on medication assistance. He did not answer his phone. HIPAA compliant message was left on his voicemail.  Patient has traditional [facility] and they have contracted with [facility] for their patient's pharmacy needs.  Patient has been approved for patient assistance with Novo Nordisk through 08/26/2019. [facility] will need to complete the patient's 2021 paperwork.  Plan: Close patient's case. Send closure letter.  Elayne Guerin, PharmD, [place] Clinical Pharmacist [phone]

## 2019-07-20 DIAGNOSIS — Z6832 Body mass index (BMI) 32.0-32.9, adult: Secondary | ICD-10-CM | POA: Diagnosis not present

## 2019-07-20 DIAGNOSIS — F22 Delusional disorders: Secondary | ICD-10-CM | POA: Diagnosis not present

## 2019-07-20 DIAGNOSIS — Z794 Long term (current) use of insulin: Secondary | ICD-10-CM | POA: Diagnosis not present

## 2019-07-20 DIAGNOSIS — I25810 Atherosclerosis of coronary artery bypass graft(s) without angina pectoris: Secondary | ICD-10-CM | POA: Diagnosis not present

## 2019-07-20 DIAGNOSIS — E6609 Other obesity due to excess calories: Secondary | ICD-10-CM | POA: Diagnosis not present

## 2019-07-20 DIAGNOSIS — I10 Essential (primary) hypertension: Secondary | ICD-10-CM | POA: Diagnosis not present

## 2019-07-20 DIAGNOSIS — E7800 Pure hypercholesterolemia, unspecified: Secondary | ICD-10-CM | POA: Diagnosis not present

## 2019-07-20 DIAGNOSIS — E1142 Type 2 diabetes mellitus with diabetic polyneuropathy: Secondary | ICD-10-CM | POA: Diagnosis not present

## 2019-07-20 DIAGNOSIS — E119 Type 2 diabetes mellitus without complications: Secondary | ICD-10-CM | POA: Diagnosis not present

## 2019-08-08 ENCOUNTER — Other Ambulatory Visit: Payer: Self-pay | Admitting: Cardiology

## 2019-08-08 ENCOUNTER — Other Ambulatory Visit: Payer: Self-pay | Admitting: Cardiovascular Disease

## 2019-08-08 MED ORDER — CARVEDILOL 25 MG PO TABS: 25.0000 mg | ORAL_TABLET | Freq: Two times a day (BID) | ORAL | 0 refills | Status: DC

## 2019-08-09 ENCOUNTER — Telehealth: Payer: Self-pay | Admitting: Cardiovascular Disease

## 2019-08-09 DIAGNOSIS — Z6832 Body mass index (BMI) 32.0-32.9, adult: Secondary | ICD-10-CM | POA: Diagnosis not present

## 2019-08-09 DIAGNOSIS — E1142 Type 2 diabetes mellitus with diabetic polyneuropathy: Secondary | ICD-10-CM | POA: Diagnosis not present

## 2019-08-09 DIAGNOSIS — Z794 Long term (current) use of insulin: Secondary | ICD-10-CM | POA: Diagnosis not present

## 2019-08-09 DIAGNOSIS — I10 Essential (primary) hypertension: Secondary | ICD-10-CM | POA: Diagnosis not present

## 2019-08-09 DIAGNOSIS — E6609 Other obesity due to excess calories: Secondary | ICD-10-CM | POA: Diagnosis not present

## 2019-08-09 DIAGNOSIS — H6022 Malignant otitis externa, left ear: Secondary | ICD-10-CM | POA: Diagnosis not present

## 2019-08-09 DIAGNOSIS — E7800 Pure hypercholesterolemia, unspecified: Secondary | ICD-10-CM | POA: Diagnosis not present

## 2019-08-09 MED ORDER — CARVEDILOL 25 MG PO TABS: 25.0000 mg | ORAL_TABLET | Freq: Two times a day (BID) | ORAL | 4 refills | Status: DC

## 2019-08-09 NOTE — Telephone Encounter (Signed)
Outpatient Medication Detail   Disp Refills Start End   carvedilol (COREG) 25 MG tablet 60 tablet 4 08/09/2019 11/07/2019   Sig - Route: Take 1 tablet (25 mg total) by mouth 2 (two) times daily. *NEEDS OFFICE VISIT FOR FURTHER REFILLS* - Oral   Sent to pharmacy as: carvedilol (COREG) 25 MG tablet   E-Prescribing Status: Receipt confirmed by pharmacy (08/09/2019  1:46 PM EST)   Pharmacy  [facility] [place] [place] [facility] [place]

## 2019-08-09 NOTE — Telephone Encounter (Signed)
APPT SCHEDULED IN MARCH, REFILLS SENT TO LAST TO SCHEDULED APPT

## 2019-08-09 NOTE — Telephone Encounter (Signed)
New Message  Pt wants prescription refilled before making appt with Dr. Claiborne Billings.  Please call to advise

## 2019-08-11 ENCOUNTER — Telehealth: Payer: Self-pay | Admitting: Cardiovascular Disease

## 2019-08-11 ENCOUNTER — Encounter: Payer: Self-pay | Admitting: Internal Medicine

## 2019-08-11 NOTE — Telephone Encounter (Addendum)
Pt called to report that he saw Dr. Patrice Paradise (Family med) back in 03/2019 and he decreased his Amlodipine to 5mg  but then he saw Dr. Ilene Qua (Internal Med) a week later and he advised him his BP was doing good 120/80 on the current 5mg  dose but he sent in 10 mg to the pharamcy.. pt says he is wondering why he sent in for the 10mg  that he picked up but should be the 5 mg and wants to know if the pharmacy will take the 10mg  RX back.. I strongly urged the pt to call Dr. Ilene Qua his PCP that has been following his BP and talk with him about the dose and what he can do with the RX he has picked up from he pharmacy. Pt agreed and will keep his 10/2019 appt with Dr. Claiborne Billings.

## 2019-08-11 NOTE — Telephone Encounter (Signed)
Patient calling to ask if there is a difference between amLODipine (NORVASC) 10 MG tablet and the 5mg  tablet. He was wondering if he should be taking 5mg . Also, he wants to know if this medication makes you tired.

## 2019-08-11 NOTE — Telephone Encounter (Signed)
error 

## 2019-08-18 DIAGNOSIS — G4733 Obstructive sleep apnea (adult) (pediatric): Secondary | ICD-10-CM | POA: Diagnosis not present

## 2019-10-06 DIAGNOSIS — G4733 Obstructive sleep apnea (adult) (pediatric): Secondary | ICD-10-CM | POA: Diagnosis not present

## 2019-10-20 DIAGNOSIS — E119 Type 2 diabetes mellitus without complications: Secondary | ICD-10-CM | POA: Diagnosis not present

## 2019-10-29 ENCOUNTER — Ambulatory Visit (INDEPENDENT_AMBULATORY_CARE_PROVIDER_SITE_OTHER): Payer: PPO | Admitting: Cardiovascular Disease

## 2019-10-29 ENCOUNTER — Other Ambulatory Visit: Payer: Self-pay

## 2019-10-29 ENCOUNTER — Encounter: Payer: Self-pay | Admitting: Cardiovascular Disease

## 2019-10-29 VITALS — BP 144/82 | HR 60 | Temp 96.9°F | Ht 70.0 in | Wt 215.0 lb

## 2019-10-29 DIAGNOSIS — G4733 Obstructive sleep apnea (adult) (pediatric): Secondary | ICD-10-CM | POA: Diagnosis not present

## 2019-10-29 DIAGNOSIS — E785 Hyperlipidemia, unspecified: Secondary | ICD-10-CM | POA: Diagnosis not present

## 2019-10-29 DIAGNOSIS — I10 Essential (primary) hypertension: Secondary | ICD-10-CM

## 2019-10-29 DIAGNOSIS — Z79899 Other long term (current) drug therapy: Secondary | ICD-10-CM | POA: Diagnosis not present

## 2019-10-29 DIAGNOSIS — I25119 Atherosclerotic heart disease of native coronary artery with unspecified angina pectoris: Secondary | ICD-10-CM

## 2019-10-29 DIAGNOSIS — Z9989 Dependence on other enabling machines and devices: Secondary | ICD-10-CM | POA: Diagnosis not present

## 2019-10-29 MED ORDER — HYDRALAZINE HCL 25 MG PO TABS: 25.0000 mg | ORAL_TABLET | Freq: Two times a day (BID) | ORAL | 2 refills | Status: DC

## 2019-10-29 NOTE — Patient Instructions (Signed)
Medication Instructions:  BEGIN TAKING HYDRALAZINE 25MG  (1 TABLET) TWICE A DAY  *If you need a refill on your cardiac medications before your next appointment, please call your pharmacy*   Lab Work: FASTING LABS: LIPIDS CMET CBC TSH  If you have labs (blood work) drawn today and your tests are completely normal, you will receive your results only by: Marland Kitchen MyChart Message (if you have MyChart) OR . A paper copy in the mail If you have any lab test that is abnormal or we need to change your treatment, we will call you to review the results.  Follow-Up: At [facility], you and your health needs are our priority.  As part of our continuing mission to provide you with exceptional heart care, we have created designated Provider Care Teams.  These Care Teams include your primary Cardiologist (physician) and Advanced Practice Providers (APPs -  Physician Assistants and Nurse Practitioners) who all work together to provide you with the care you need, when you need it.  We recommend signing up for the patient portal called "MyChart".  Sign up information is provided on this After Visit Summary.  MyChart is used to connect with patients for Virtual Visits (Telemedicine).  Patients are able to view lab/test results, encounter notes, upcoming appointments, etc.  Non-urgent messages can be sent to your provider as well.   To learn more about what you can do with MyChart, go to [email or web address].    Your next appointment:   6 month(s)  The format for your next appointment:   In Person  Provider:   Shelva Majestic, MD
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
## 2019-10-31 ENCOUNTER — Encounter: Payer: Self-pay | Admitting: Cardiovascular Disease

## 2019-11-01 ENCOUNTER — Telehealth: Payer: Self-pay | Admitting: *Deleted

## 2019-11-01 DIAGNOSIS — I10 Essential (primary) hypertension: Secondary | ICD-10-CM | POA: Diagnosis not present

## 2019-11-01 DIAGNOSIS — Z79899 Other long term (current) drug therapy: Secondary | ICD-10-CM | POA: Diagnosis not present

## 2019-11-01 DIAGNOSIS — E785 Hyperlipidemia, unspecified: Secondary | ICD-10-CM | POA: Diagnosis not present

## 2019-11-01 DIAGNOSIS — I2510 Atherosclerotic heart disease of native coronary artery without angina pectoris: Secondary | ICD-10-CM | POA: Diagnosis not present

## 2019-11-01 NOTE — Telephone Encounter (Signed)
Left message to return a call to me to tell me which DME company I need to send order for new hose.

## 2019-11-01 NOTE — Telephone Encounter (Signed)
-----   Message from Darlyn Chamber June, RN sent at 10/29/2019 10:52 AM EST ----- Pt says he needs a new hose.. thanks

## 2019-11-02 ENCOUNTER — Telehealth: Payer: Self-pay | Admitting: *Deleted

## 2019-11-02 LAB — COMPREHENSIVE METABOLIC PANEL
ALT: 24 IU/L (ref 0–44)
AST: 28 IU/L (ref 0–40)
Albumin/Globulin Ratio: 2 (ref 1.2–2.2)
Albumin: 4.2 g/dL (ref 3.7–4.7)
Alkaline Phosphatase: 84 IU/L (ref 39–117)
BUN/Creatinine Ratio: 18 (ref 10–24)
BUN: 17 mg/dL (ref 8–27)
Bilirubin Total: 0.4 mg/dL (ref 0.0–1.2)
CO2: 23 mmol/L (ref 20–29)
Calcium: 9.4 mg/dL (ref 8.6–10.2)
Chloride: 104 mmol/L (ref 96–106)
Creatinine, Ser: 0.92 mg/dL (ref 0.76–1.27)
GFR calc Af Amer: 94 mL/min/{1.73_m2} (ref >59)
GFR calc non Af Amer: 82 mL/min/{1.73_m2} (ref >59)
Globulin, Total: 2.1 g/dL (ref 1.5–4.5)
Glucose: 115 mg/dL — ABNORMAL HIGH (ref 65–99)
Potassium: 3.9 mmol/L (ref 3.5–5.2)
Sodium: 141 mmol/L (ref 134–144)
Total Protein: 6.3 g/dL (ref 6.0–8.5)

## 2019-11-02 LAB — LIPID PANEL
Chol/HDL Ratio: 3.9 ratio (ref 0.0–5.0)
Cholesterol, Total: 172 mg/dL (ref 100–199)
HDL: 44 mg/dL (ref >39)
LDL Chol Calc (NIH): 110 mg/dL — ABNORMAL HIGH (ref 0–99)
Triglycerides: 100 mg/dL (ref 0–149)
VLDL Cholesterol Cal: 18 mg/dL (ref 5–40)

## 2019-11-02 LAB — CBC
Hematocrit: 43.3 % (ref 37.5–51.0)
Hemoglobin: 14.9 g/dL (ref 13.0–17.7)
MCH: 30.3 pg (ref 26.6–33.0)
MCHC: 34.4 g/dL (ref 31.5–35.7)
MCV: 88 fL (ref 79–97)
Platelets: 157 10*3/uL (ref 150–450)
RBC: 4.91 x10E6/uL (ref 4.14–5.80)
RDW: 12.5 % (ref 11.6–15.4)
WBC: 6.2 10*3/uL (ref 3.4–10.8)

## 2019-11-02 LAB — TSH: TSH: 1.66 u[IU]/mL (ref 0.450–4.500)

## 2019-11-02 NOTE — Telephone Encounter (Signed)
Left message for patient to return a call to me. I may have a CPAP hose for him to come by here to pick up. I just need to confirm the type of hose he has.

## 2019-11-09 NOTE — Progress Notes (Signed)
Called pt and reviewed results and recommendations from [facility]. pt states that he does not want to change to Crestor because he was unable to tolerate the medication last time. He states that he had not been taking his Pravastatin daily and that he would prefer to just try to take the medication more often. Notified that if he had issues with this medication to let us know.  He had no further questions at this time. Will make Dr.Kelly aware.

## 2019-12-02 DIAGNOSIS — Z8679 Personal history of other diseases of the circulatory system: Secondary | ICD-10-CM | POA: Diagnosis not present

## 2019-12-02 DIAGNOSIS — I10 Essential (primary) hypertension: Secondary | ICD-10-CM | POA: Diagnosis not present

## 2019-12-02 DIAGNOSIS — I129 Hypertensive chronic kidney disease with stage 1 through stage 4 chronic kidney disease, or unspecified chronic kidney disease: Secondary | ICD-10-CM | POA: Diagnosis not present

## 2019-12-02 DIAGNOSIS — R0602 Shortness of breath: Secondary | ICD-10-CM | POA: Diagnosis not present

## 2019-12-02 DIAGNOSIS — N182 Chronic kidney disease, stage 2 (mild): Secondary | ICD-10-CM | POA: Diagnosis not present

## 2019-12-02 DIAGNOSIS — J449 Chronic obstructive pulmonary disease, unspecified: Secondary | ICD-10-CM | POA: Diagnosis not present

## 2019-12-02 DIAGNOSIS — I252 Old myocardial infarction: Secondary | ICD-10-CM | POA: Diagnosis not present

## 2019-12-02 DIAGNOSIS — I25110 Atherosclerotic heart disease of native coronary artery with unstable angina pectoris: Secondary | ICD-10-CM | POA: Diagnosis not present

## 2019-12-02 DIAGNOSIS — Z955 Presence of coronary angioplasty implant and graft: Secondary | ICD-10-CM | POA: Diagnosis not present

## 2019-12-02 DIAGNOSIS — E785 Hyperlipidemia, unspecified: Secondary | ICD-10-CM | POA: Diagnosis not present

## 2019-12-02 DIAGNOSIS — E1122 Type 2 diabetes mellitus with diabetic chronic kidney disease: Secondary | ICD-10-CM | POA: Diagnosis not present

## 2019-12-02 DIAGNOSIS — R197 Diarrhea, unspecified: Secondary | ICD-10-CM | POA: Diagnosis not present

## 2019-12-02 DIAGNOSIS — Z6832 Body mass index (BMI) 32.0-32.9, adult: Secondary | ICD-10-CM | POA: Diagnosis not present

## 2019-12-02 DIAGNOSIS — F5101 Primary insomnia: Secondary | ICD-10-CM | POA: Diagnosis not present

## 2019-12-02 DIAGNOSIS — R0789 Other chest pain: Secondary | ICD-10-CM | POA: Diagnosis not present

## 2019-12-02 DIAGNOSIS — F419 Anxiety disorder, unspecified: Secondary | ICD-10-CM | POA: Diagnosis not present

## 2019-12-02 DIAGNOSIS — I200 Unstable angina: Secondary | ICD-10-CM | POA: Diagnosis not present

## 2019-12-02 DIAGNOSIS — K30 Functional dyspepsia: Secondary | ICD-10-CM | POA: Diagnosis not present

## 2019-12-02 DIAGNOSIS — Z791 Long term (current) use of non-steroidal anti-inflammatories (NSAID): Secondary | ICD-10-CM | POA: Diagnosis not present

## 2019-12-02 DIAGNOSIS — Z7902 Long term (current) use of antithrombotics/antiplatelets: Secondary | ICD-10-CM | POA: Diagnosis not present

## 2019-12-02 DIAGNOSIS — R9431 Abnormal electrocardiogram [ECG] [EKG]: Secondary | ICD-10-CM | POA: Diagnosis not present

## 2019-12-02 DIAGNOSIS — R079 Chest pain, unspecified: Secondary | ICD-10-CM | POA: Diagnosis not present

## 2019-12-02 DIAGNOSIS — Z7982 Long term (current) use of aspirin: Secondary | ICD-10-CM | POA: Diagnosis not present

## 2019-12-02 DIAGNOSIS — Z9989 Dependence on other enabling machines and devices: Secondary | ICD-10-CM | POA: Diagnosis not present

## 2019-12-02 DIAGNOSIS — Z79899 Other long term (current) drug therapy: Secondary | ICD-10-CM | POA: Diagnosis not present

## 2019-12-02 DIAGNOSIS — G4733 Obstructive sleep apnea (adult) (pediatric): Secondary | ICD-10-CM | POA: Diagnosis not present

## 2019-12-02 DIAGNOSIS — Z87891 Personal history of nicotine dependence: Secondary | ICD-10-CM | POA: Diagnosis not present

## 2019-12-02 DIAGNOSIS — Z794 Long term (current) use of insulin: Secondary | ICD-10-CM | POA: Diagnosis not present

## 2019-12-02 DIAGNOSIS — D72829 Elevated white blood cell count, unspecified: Secondary | ICD-10-CM | POA: Diagnosis not present

## 2019-12-02 DIAGNOSIS — E669 Obesity, unspecified: Secondary | ICD-10-CM | POA: Diagnosis not present

## 2019-12-02 DIAGNOSIS — K449 Diaphragmatic hernia without obstruction or gangrene: Secondary | ICD-10-CM | POA: Diagnosis not present

## 2019-12-02 DIAGNOSIS — R071 Chest pain on breathing: Secondary | ICD-10-CM | POA: Diagnosis not present

## 2019-12-02 DIAGNOSIS — J40 Bronchitis, not specified as acute or chronic: Secondary | ICD-10-CM | POA: Diagnosis not present

## 2019-12-02 DIAGNOSIS — I25810 Atherosclerosis of coronary artery bypass graft(s) without angina pectoris: Secondary | ICD-10-CM | POA: Diagnosis not present

## 2019-12-02 DIAGNOSIS — I517 Cardiomegaly: Secondary | ICD-10-CM | POA: Diagnosis not present

## 2019-12-03 DIAGNOSIS — Z955 Presence of coronary angioplasty implant and graft: Secondary | ICD-10-CM | POA: Diagnosis not present

## 2019-12-03 DIAGNOSIS — R7989 Other specified abnormal findings of blood chemistry: Secondary | ICD-10-CM | POA: Diagnosis not present

## 2019-12-03 DIAGNOSIS — G4733 Obstructive sleep apnea (adult) (pediatric): Secondary | ICD-10-CM | POA: Diagnosis not present

## 2019-12-03 DIAGNOSIS — Z9989 Dependence on other enabling machines and devices: Secondary | ICD-10-CM | POA: Diagnosis not present

## 2019-12-04 MED ORDER — CLOPIDOGREL BISULFATE 75 MG PO TABS
75.00 | ORAL_TABLET | ORAL | Status: DC
Start: 2019-12-04 — End: 2019-12-04

## 2019-12-04 MED ORDER — CELECOXIB 100 MG PO CAPS
100.00 | ORAL_CAPSULE | ORAL | Status: DC
Start: 2019-12-04 — End: 2019-12-04

## 2019-12-04 MED ORDER — ACETAMINOPHEN 325 MG PO TABS
650.00 | ORAL_TABLET | ORAL | Status: DC
Start: ? — End: 2019-12-04

## 2019-12-04 MED ORDER — MELATONIN 3 MG PO TABS
3.00 | ORAL_TABLET | ORAL | Status: DC
Start: ? — End: 2019-12-04

## 2019-12-04 MED ORDER — LOSARTAN POTASSIUM 50 MG PO TABS
100.00 | ORAL_TABLET | ORAL | Status: DC
Start: 2019-12-04 — End: 2019-12-04

## 2019-12-04 MED ORDER — ALUM & MAG HYDROXIDE-SIMETH 200-200-20 MG/5ML PO SUSP
30.00 | ORAL | Status: DC
Start: ? — End: 2019-12-04

## 2019-12-04 MED ORDER — ASPIRIN 81 MG PO CHEW
81.00 | CHEWABLE_TABLET | ORAL | Status: DC
Start: 2019-12-04 — End: 2019-12-04

## 2019-12-04 MED ORDER — PRAVASTATIN SODIUM 40 MG PO TABS
80.00 | ORAL_TABLET | ORAL | Status: DC
Start: 2019-12-04 — End: 2019-12-04

## 2019-12-04 MED ORDER — ONDANSETRON HCL 4 MG/2ML IJ SOLN
4.00 | INTRAMUSCULAR | Status: DC
Start: ? — End: 2019-12-04

## 2019-12-04 MED ORDER — CHOLECALCIFEROL 25 MCG (1000 UT) PO TABS
5000.00 | ORAL_TABLET | ORAL | Status: DC
Start: 2019-12-04 — End: 2019-12-04

## 2019-12-04 MED ORDER — POTASSIUM CHLORIDE IN NACL 20-0.9 MEQ/L-% IV SOLN
INTRAVENOUS | Status: DC
Start: ? — End: 2019-12-04

## 2019-12-04 MED ORDER — GABAPENTIN 100 MG PO CAPS
200.00 | ORAL_CAPSULE | ORAL | Status: DC
Start: 2019-12-03 — End: 2019-12-04

## 2019-12-04 MED ORDER — ENOXAPARIN SODIUM 30 MG/0.3ML ~~LOC~~ SOLN
30.00 | SUBCUTANEOUS | Status: DC
Start: 2019-12-03 — End: 2019-12-04

## 2019-12-04 MED ORDER — CARVEDILOL 12.5 MG PO TABS
12.50 | ORAL_TABLET | ORAL | Status: DC
Start: 2019-12-03 — End: 2019-12-04

## 2019-12-04 MED ORDER — HYDRALAZINE HCL 10 MG PO TABS
10.00 | ORAL_TABLET | ORAL | Status: DC
Start: ? — End: 2019-12-04

## 2019-12-04 MED ORDER — GENERIC EXTERNAL MEDICATION
30.00 | Status: DC
Start: 2019-12-03 — End: 2019-12-04

## 2019-12-04 MED ORDER — GENERIC EXTERNAL MEDICATION
Status: DC
Start: ? — End: 2019-12-04

## 2019-12-06 ENCOUNTER — Telehealth: Payer: Self-pay | Admitting: Cardiovascular Disease

## 2019-12-06 NOTE — Telephone Encounter (Signed)
Spoke with patient and patient said he no longer needed an appt. and will call back at another time.

## 2019-12-14 DIAGNOSIS — E1142 Type 2 diabetes mellitus with diabetic polyneuropathy: Secondary | ICD-10-CM | POA: Diagnosis not present

## 2019-12-14 DIAGNOSIS — Z794 Long term (current) use of insulin: Secondary | ICD-10-CM | POA: Diagnosis not present

## 2019-12-14 DIAGNOSIS — G4733 Obstructive sleep apnea (adult) (pediatric): Secondary | ICD-10-CM | POA: Diagnosis not present

## 2019-12-14 DIAGNOSIS — Z9989 Dependence on other enabling machines and devices: Secondary | ICD-10-CM | POA: Diagnosis not present

## 2019-12-14 DIAGNOSIS — I10 Essential (primary) hypertension: Secondary | ICD-10-CM | POA: Diagnosis not present

## 2019-12-14 DIAGNOSIS — E119 Type 2 diabetes mellitus without complications: Secondary | ICD-10-CM | POA: Diagnosis not present

## 2019-12-14 DIAGNOSIS — I2510 Atherosclerotic heart disease of native coronary artery without angina pectoris: Secondary | ICD-10-CM | POA: Diagnosis not present

## 2019-12-16 ENCOUNTER — Other Ambulatory Visit: Payer: Self-pay

## 2019-12-16 MED ORDER — CARVEDILOL 25 MG PO TABS: 25.0000 mg | ORAL_TABLET | Freq: Two times a day (BID) | ORAL | 1 refills | Status: DC

## 2019-12-16 NOTE — Telephone Encounter (Signed)
This is Dr. Kelly's pt. °

## 2019-12-20 ENCOUNTER — Telehealth: Payer: Self-pay | Admitting: Cardiovascular Disease

## 2019-12-20 NOTE — Telephone Encounter (Signed)
Patient states he has been experiencing extreme fatigue, however he has not had any other symptoms. Please call to discuss.

## 2019-12-20 NOTE — Telephone Encounter (Signed)
Called and spoke with pt, he states he isnt extremely fatigued, but he is tired and sleepy. He states that on 12/02/19 he was not doing well and was not sleeping well and had some discomfort in his chest and saw his PCP. He states that his PCP reviewed his records and saw his history of stents and heart attack and sent the pt to [facility] to be evaluated where he stayed for one night.  He states that while he was there they thought he may have had bronchitis and that they cut back on his medications. He reports that they "decreased his carvedilol to 12.5mg  BID instead of 25mg  BID, he reports they took him off his amlodipine, zestril, and zetia". He was then told to call his cardiologist to discuss these changes. Pt states that he took his blood sugar an hour and 15 minutes ago and was 150, he rechecked this while on the phone and it was 189. He states he also took his BP at 1:00pm and it was 153 over something and he checked this while on the phone as well and it was 178/75 with  HR of 76. He denies SOB, chest pain, Dizziness, blurry vision, or Head ache at this time.   He is wondering if maybe one of his stents has closed up or if his diet/diabetes has something to do with him being tired. He also reports never starting the Hydralazine 25mg  BID dr.kelly prescribed at his last OV. Pt asking if "drinking a few beers can have an effect". Notified that alcohol may have an effect on these things such as his blood sure level and BP as well.   He reports that his sugars have recently been 162, 160, 177 and then the numbers he gave today. He reports that he may have ate too much salt and that one morning after breakfast (eggs, bacon, etc.) his sugars were 230-231. And this morning his sugar was 195  Advised pt to decrease his salt intake and to monitor his BP for the time being and keep a log of these numbers. Notified I would send this message to Dr.Kelly to review. Notified if he became symptomatic, such  as chest pain, SOB, HA, blurry vision, or dizzy to seek immediate evaluation in the ED. Pt verbalized understanding with all instructions and had no other questions at this time.

## 2019-12-28 NOTE — Telephone Encounter (Signed)
Spoke with pt who report he has noticed an increase in his BP and wanted to make MD aware. BP listed below. Pt denies symtpoms and report increase could be related to stress factors.  172/90 HR 84 ( 8am) 181/92 HR 78 (3:15 pm) 159/81 HR 80 (3:25 pm) 184/94 HR 78 (3:40 pm) 173/75 HR 72 (4:30 pm) 175/65 HR 69 (4:35 pm) 146/70 HR 70 (4:45 pm)

## 2019-12-28 NOTE — Telephone Encounter (Signed)
Pt c/o BP issue: STAT if pt c/o blurred vision, one-sided weakness or slurred speech  1. What are your last 5 BP readings? this  morning 172/90 pulse 84, at 3:15 it was 181/92 and pulse 78, 3:25 159/81 and pulse was 690, 3:40 it was 184/94 and pulse 78  2. Are you having any other symptoms (ex. Dizziness, headache, blurred vision, passed out)?no  3. What is your BP issue?  Blood pressure running high

## 2019-12-28 NOTE — Telephone Encounter (Signed)
We need to find out exactly which medicines he discontinued.  He may still be taking some of them even if [place] evaluation recommended discontinuance.  Obviously he needs to be on warm medication since his blood pressure is now significantly increased.

## 2019-12-29 ENCOUNTER — Other Ambulatory Visit: Payer: Self-pay

## 2019-12-29 MED ORDER — AMLODIPINE BESYLATE 5 MG PO TABS: 5.0000 mg | ORAL_TABLET | Freq: Every day | ORAL | 3 refills | Status: DC

## 2019-12-29 NOTE — Telephone Encounter (Signed)
Called and spoke with pt regarding Dr.Kelly's recommendations.  Pt verbalized understanding and would like the prescription to be sent in for 5mg  not 10mg  at the moment so he does not have to cut anything in half. Notified this was fine and to keep track of his BP for a week and call back with the numbers. He is aware to notify us if his BP is still elevated so we can increase his amlodipine to 10mg  at that time. No other questions from pt. Will send prescription.

## 2019-12-29 NOTE — Telephone Encounter (Signed)
Called and clarified with pt, he is only taking the 12.5mg  of carvedilol twice a day for his BP. He is not taking his amlodipine or the hydralazine. Will make Dr.Kelly aware.

## 2019-12-29 NOTE — Telephone Encounter (Signed)
We will give regimen at 5 mg to start if blood pressure remains elevated, resume amlodipine initially 5 mg and monitor blood pressure if continues to be elevated after 1 week resume prior dose at 10 mg

## 2020-01-04 DIAGNOSIS — E1142 Type 2 diabetes mellitus with diabetic polyneuropathy: Secondary | ICD-10-CM | POA: Diagnosis not present

## 2020-01-04 DIAGNOSIS — F22 Delusional disorders: Secondary | ICD-10-CM | POA: Diagnosis not present

## 2020-01-04 DIAGNOSIS — E119 Type 2 diabetes mellitus without complications: Secondary | ICD-10-CM | POA: Diagnosis not present

## 2020-01-04 DIAGNOSIS — Z794 Long term (current) use of insulin: Secondary | ICD-10-CM | POA: Diagnosis not present

## 2020-01-04 DIAGNOSIS — E7800 Pure hypercholesterolemia, unspecified: Secondary | ICD-10-CM | POA: Diagnosis not present

## 2020-01-06 ENCOUNTER — Encounter: Payer: Self-pay | Admitting: *Deleted

## 2020-01-06 ENCOUNTER — Telehealth: Payer: Self-pay | Admitting: *Deleted

## 2020-01-06 NOTE — Patient Outreach (Signed)
[facility]) Care Management  01/06/2020  [facility] 1946/01/24 PT:[id number]  Telephone outreach to primary care office of Dr. Arlyss Repress, to request email for MD so NP can communicate concern. Left message for a return call.  Eulah Pont. Myrtie Neither, MSN, [facility] Nurse Practitioner [facility] [phone]

## 2020-01-19 ENCOUNTER — Telehealth: Payer: Self-pay | Admitting: Cardiovascular Disease

## 2020-01-19 NOTE — Telephone Encounter (Signed)
The patient is calling in to let Dr. Claiborne Billings know that his BP has been much better. He has been taking his BP twice daily. He does not have those readings with him at this time but wanted to let us know that he has been feeling good. I told the patient to call back with any new concerns. Will route to Dr. Claiborne Billings as an Juluis Rainier.

## 2020-01-19 NOTE — Telephone Encounter (Signed)
New Message    Pt is calling to let Dr Claiborne Billings know his BP is doing better.  He says his BP will be elevated in the morning but after taking his medications, it regulates    Please advise

## 2020-02-02 ENCOUNTER — Telehealth: Payer: Self-pay | Admitting: Cardiovascular Disease

## 2020-02-02 NOTE — Telephone Encounter (Signed)
New message   Patient would like the dosage changed on carvedilol (COREG) 25 MG tablet to a 12.50 mg. He states that his b/p is better. Please call to discuss.

## 2020-02-02 NOTE — Telephone Encounter (Signed)
Left message for pt to call.

## 2020-02-07 NOTE — Telephone Encounter (Signed)
Left message for pt to call.

## 2020-03-29 DIAGNOSIS — H25013 Cortical age-related cataract, bilateral: Secondary | ICD-10-CM | POA: Diagnosis not present

## 2020-03-29 DIAGNOSIS — E119 Type 2 diabetes mellitus without complications: Secondary | ICD-10-CM | POA: Diagnosis not present

## 2020-03-29 DIAGNOSIS — H25043 Posterior subcapsular polar age-related cataract, bilateral: Secondary | ICD-10-CM | POA: Diagnosis not present

## 2020-03-29 DIAGNOSIS — H2513 Age-related nuclear cataract, bilateral: Secondary | ICD-10-CM | POA: Diagnosis not present

## 2020-03-29 DIAGNOSIS — H2512 Age-related nuclear cataract, left eye: Secondary | ICD-10-CM | POA: Diagnosis not present

## 2020-04-06 DIAGNOSIS — G4733 Obstructive sleep apnea (adult) (pediatric): Secondary | ICD-10-CM | POA: Diagnosis not present

## 2020-04-11 DIAGNOSIS — Z6832 Body mass index (BMI) 32.0-32.9, adult: Secondary | ICD-10-CM | POA: Diagnosis not present

## 2020-04-11 DIAGNOSIS — E119 Type 2 diabetes mellitus without complications: Secondary | ICD-10-CM | POA: Diagnosis not present

## 2020-04-11 DIAGNOSIS — Z7984 Long term (current) use of oral hypoglycemic drugs: Secondary | ICD-10-CM | POA: Diagnosis not present

## 2020-04-11 DIAGNOSIS — E1142 Type 2 diabetes mellitus with diabetic polyneuropathy: Secondary | ICD-10-CM | POA: Diagnosis not present

## 2020-04-11 DIAGNOSIS — Z794 Long term (current) use of insulin: Secondary | ICD-10-CM | POA: Diagnosis not present

## 2020-04-11 DIAGNOSIS — E7800 Pure hypercholesterolemia, unspecified: Secondary | ICD-10-CM | POA: Diagnosis not present

## 2020-04-11 DIAGNOSIS — I25810 Atherosclerosis of coronary artery bypass graft(s) without angina pectoris: Secondary | ICD-10-CM | POA: Diagnosis not present

## 2020-04-11 DIAGNOSIS — E6609 Other obesity due to excess calories: Secondary | ICD-10-CM | POA: Diagnosis not present

## 2020-04-11 DIAGNOSIS — I10 Essential (primary) hypertension: Secondary | ICD-10-CM | POA: Diagnosis not present

## 2020-04-20 DIAGNOSIS — R2689 Other abnormalities of gait and mobility: Secondary | ICD-10-CM | POA: Diagnosis not present

## 2020-04-20 DIAGNOSIS — M79602 Pain in left arm: Secondary | ICD-10-CM | POA: Diagnosis not present

## 2020-04-20 DIAGNOSIS — E1142 Type 2 diabetes mellitus with diabetic polyneuropathy: Secondary | ICD-10-CM | POA: Diagnosis not present

## 2020-04-20 DIAGNOSIS — I2510 Atherosclerotic heart disease of native coronary artery without angina pectoris: Secondary | ICD-10-CM | POA: Diagnosis not present

## 2020-04-20 DIAGNOSIS — I10 Essential (primary) hypertension: Secondary | ICD-10-CM | POA: Diagnosis not present

## 2020-04-20 DIAGNOSIS — Z20822 Contact with and (suspected) exposure to covid-19: Secondary | ICD-10-CM | POA: Diagnosis not present

## 2020-04-20 DIAGNOSIS — E785 Hyperlipidemia, unspecified: Secondary | ICD-10-CM | POA: Diagnosis not present

## 2020-04-20 DIAGNOSIS — Z87891 Personal history of nicotine dependence: Secondary | ICD-10-CM | POA: Diagnosis not present

## 2020-04-20 DIAGNOSIS — Z8673 Personal history of transient ischemic attack (TIA), and cerebral infarction without residual deficits: Secondary | ICD-10-CM | POA: Diagnosis not present

## 2020-04-20 DIAGNOSIS — Z888 Allergy status to other drugs, medicaments and biological substances status: Secondary | ICD-10-CM | POA: Diagnosis not present

## 2020-04-20 DIAGNOSIS — S01111A Laceration without foreign body of right eyelid and periocular area, initial encounter: Secondary | ICD-10-CM | POA: Diagnosis not present

## 2020-04-20 DIAGNOSIS — Z8546 Personal history of malignant neoplasm of prostate: Secondary | ICD-10-CM | POA: Diagnosis not present

## 2020-04-21 DIAGNOSIS — Z794 Long term (current) use of insulin: Secondary | ICD-10-CM | POA: Diagnosis not present

## 2020-04-21 DIAGNOSIS — R2681 Unsteadiness on feet: Secondary | ICD-10-CM | POA: Diagnosis not present

## 2020-04-21 DIAGNOSIS — S060X0D Concussion without loss of consciousness, subsequent encounter: Secondary | ICD-10-CM | POA: Diagnosis not present

## 2020-04-21 DIAGNOSIS — I10 Essential (primary) hypertension: Secondary | ICD-10-CM | POA: Diagnosis not present

## 2020-04-21 DIAGNOSIS — R42 Dizziness and giddiness: Secondary | ICD-10-CM | POA: Diagnosis not present

## 2020-04-21 DIAGNOSIS — E1140 Type 2 diabetes mellitus with diabetic neuropathy, unspecified: Secondary | ICD-10-CM | POA: Diagnosis not present

## 2020-04-21 DIAGNOSIS — E785 Hyperlipidemia, unspecified: Secondary | ICD-10-CM | POA: Diagnosis not present

## 2020-04-22 DIAGNOSIS — I10 Essential (primary) hypertension: Secondary | ICD-10-CM | POA: Diagnosis not present

## 2020-04-22 DIAGNOSIS — S060X0D Concussion without loss of consciousness, subsequent encounter: Secondary | ICD-10-CM | POA: Diagnosis not present

## 2020-04-22 DIAGNOSIS — E785 Hyperlipidemia, unspecified: Secondary | ICD-10-CM | POA: Diagnosis not present

## 2020-04-22 DIAGNOSIS — Z794 Long term (current) use of insulin: Secondary | ICD-10-CM | POA: Diagnosis not present

## 2020-04-22 DIAGNOSIS — R42 Dizziness and giddiness: Secondary | ICD-10-CM | POA: Diagnosis not present

## 2020-04-22 DIAGNOSIS — E1140 Type 2 diabetes mellitus with diabetic neuropathy, unspecified: Secondary | ICD-10-CM | POA: Diagnosis not present

## 2020-04-22 DIAGNOSIS — R2681 Unsteadiness on feet: Secondary | ICD-10-CM | POA: Diagnosis not present

## 2020-04-23 DIAGNOSIS — E785 Hyperlipidemia, unspecified: Secondary | ICD-10-CM | POA: Diagnosis not present

## 2020-04-23 DIAGNOSIS — S060X0D Concussion without loss of consciousness, subsequent encounter: Secondary | ICD-10-CM | POA: Diagnosis not present

## 2020-04-23 DIAGNOSIS — I10 Essential (primary) hypertension: Secondary | ICD-10-CM | POA: Diagnosis not present

## 2020-04-23 DIAGNOSIS — Z794 Long term (current) use of insulin: Secondary | ICD-10-CM | POA: Diagnosis not present

## 2020-04-23 DIAGNOSIS — E1140 Type 2 diabetes mellitus with diabetic neuropathy, unspecified: Secondary | ICD-10-CM | POA: Diagnosis not present

## 2020-04-23 DIAGNOSIS — R2681 Unsteadiness on feet: Secondary | ICD-10-CM | POA: Diagnosis not present

## 2020-04-23 DIAGNOSIS — R42 Dizziness and giddiness: Secondary | ICD-10-CM | POA: Diagnosis not present

## 2020-04-24 DIAGNOSIS — R42 Dizziness and giddiness: Secondary | ICD-10-CM | POA: Diagnosis not present

## 2020-04-24 DIAGNOSIS — R2681 Unsteadiness on feet: Secondary | ICD-10-CM | POA: Diagnosis not present

## 2020-04-24 DIAGNOSIS — I10 Essential (primary) hypertension: Secondary | ICD-10-CM | POA: Diagnosis not present

## 2020-04-24 DIAGNOSIS — Z794 Long term (current) use of insulin: Secondary | ICD-10-CM | POA: Diagnosis not present

## 2020-04-24 DIAGNOSIS — E785 Hyperlipidemia, unspecified: Secondary | ICD-10-CM | POA: Diagnosis not present

## 2020-04-24 DIAGNOSIS — S060X0D Concussion without loss of consciousness, subsequent encounter: Secondary | ICD-10-CM | POA: Diagnosis not present

## 2020-04-24 DIAGNOSIS — E1140 Type 2 diabetes mellitus with diabetic neuropathy, unspecified: Secondary | ICD-10-CM | POA: Diagnosis not present

## 2020-04-25 DIAGNOSIS — E785 Hyperlipidemia, unspecified: Secondary | ICD-10-CM | POA: Diagnosis not present

## 2020-04-25 DIAGNOSIS — I10 Essential (primary) hypertension: Secondary | ICD-10-CM | POA: Diagnosis not present

## 2020-04-25 DIAGNOSIS — S060X0D Concussion without loss of consciousness, subsequent encounter: Secondary | ICD-10-CM | POA: Diagnosis not present

## 2020-04-26 DIAGNOSIS — E785 Hyperlipidemia, unspecified: Secondary | ICD-10-CM | POA: Diagnosis not present

## 2020-04-26 DIAGNOSIS — S060X0A Concussion without loss of consciousness, initial encounter: Secondary | ICD-10-CM | POA: Diagnosis not present

## 2020-04-26 DIAGNOSIS — I10 Essential (primary) hypertension: Secondary | ICD-10-CM | POA: Diagnosis not present

## 2020-04-27 ENCOUNTER — Telehealth: Payer: Self-pay | Admitting: Cardiovascular Disease
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
## 2020-04-27 NOTE — Telephone Encounter (Signed)
The number listed below is not in service  I placed call to number listed for patient in this note and left message to call office

## 2020-04-28 NOTE — Telephone Encounter (Signed)
Spoke with pt, the correct telephone number is [phone]. Spoke with amber at [facility], confirmed cardiac medications.

## 2020-04-30 ENCOUNTER — Telehealth: Payer: Self-pay | Admitting: Physician Assistant

## 2020-04-30 NOTE — Telephone Encounter (Signed)
   Pt called because he wants to get out of [facility]. Feels he is not making progress.   He coughs frequently on the phone, but denies any respiratory issues. Says he has asked for cough medicine but has not gotten any.   He is not having chest pain.   Advised him that we cannot get him out of there since we did not put him in.   He says staff are not responsive to his complaints. He wants a bath, wants to get PT, wants to go home.   Advised him to keep trying. Will route this note to Dr Gaylan Gerold.   Rosaria Ferries, PA-C 04/30/2020 8:40 AM

## 2020-05-02 ENCOUNTER — Telehealth: Payer: Self-pay | Admitting: Cardiovascular Disease

## 2020-05-02 DIAGNOSIS — E1151 Type 2 diabetes mellitus with diabetic peripheral angiopathy without gangrene: Secondary | ICD-10-CM | POA: Diagnosis not present

## 2020-05-02 DIAGNOSIS — M2550 Pain in unspecified joint: Secondary | ICD-10-CM | POA: Diagnosis not present

## 2020-05-02 DIAGNOSIS — I2510 Atherosclerotic heart disease of native coronary artery without angina pectoris: Secondary | ICD-10-CM | POA: Diagnosis not present

## 2020-05-02 DIAGNOSIS — R262 Difficulty in walking, not elsewhere classified: Secondary | ICD-10-CM | POA: Diagnosis not present

## 2020-05-02 NOTE — Telephone Encounter (Signed)
Pt called because he needs occupation therapy and he wants to know if Dr. Gwenlyn Found could help him with that

## 2020-05-02 NOTE — Telephone Encounter (Signed)
Left message for patient that therapy is ideally ordered by PCP and to ask Dr. Ilene Qua about ordering. Instructed him to call if he has any cardiac concerns.

## 2020-05-05 ENCOUNTER — Telehealth: Payer: Self-pay | Admitting: Cardiovascular Disease

## 2020-05-05 NOTE — Telephone Encounter (Signed)
   Pt is following up if Dr. Claiborne Billings can order OT for him. Per notes 05/02/2020 advised pt needs to call pcp. Pt understood

## 2020-05-10 DIAGNOSIS — Z794 Long term (current) use of insulin: Secondary | ICD-10-CM | POA: Diagnosis not present

## 2020-05-10 DIAGNOSIS — I25810 Atherosclerosis of coronary artery bypass graft(s) without angina pectoris: Secondary | ICD-10-CM | POA: Diagnosis not present

## 2020-05-10 DIAGNOSIS — E1142 Type 2 diabetes mellitus with diabetic polyneuropathy: Secondary | ICD-10-CM | POA: Diagnosis not present

## 2020-05-10 DIAGNOSIS — I10 Essential (primary) hypertension: Secondary | ICD-10-CM | POA: Diagnosis not present

## 2020-05-10 DIAGNOSIS — M7552 Bursitis of left shoulder: Secondary | ICD-10-CM | POA: Diagnosis not present

## 2020-05-16 DIAGNOSIS — M25512 Pain in left shoulder: Secondary | ICD-10-CM | POA: Diagnosis not present

## 2020-05-16 DIAGNOSIS — M25612 Stiffness of left shoulder, not elsewhere classified: Secondary | ICD-10-CM | POA: Diagnosis not present

## 2020-05-16 DIAGNOSIS — M542 Cervicalgia: Secondary | ICD-10-CM | POA: Diagnosis not present

## 2020-05-16 DIAGNOSIS — M5412 Radiculopathy, cervical region: Secondary | ICD-10-CM | POA: Diagnosis not present

## 2020-05-19 DIAGNOSIS — M5412 Radiculopathy, cervical region: Secondary | ICD-10-CM | POA: Diagnosis not present

## 2020-05-19 DIAGNOSIS — M25512 Pain in left shoulder: Secondary | ICD-10-CM | POA: Diagnosis not present

## 2020-05-19 DIAGNOSIS — M25612 Stiffness of left shoulder, not elsewhere classified: Secondary | ICD-10-CM | POA: Diagnosis not present

## 2020-05-19 DIAGNOSIS — M542 Cervicalgia: Secondary | ICD-10-CM | POA: Diagnosis not present

## 2020-05-24 DIAGNOSIS — M542 Cervicalgia: Secondary | ICD-10-CM | POA: Diagnosis not present

## 2020-05-24 DIAGNOSIS — M25612 Stiffness of left shoulder, not elsewhere classified: Secondary | ICD-10-CM | POA: Diagnosis not present

## 2020-05-24 DIAGNOSIS — M25512 Pain in left shoulder: Secondary | ICD-10-CM | POA: Diagnosis not present

## 2020-05-24 DIAGNOSIS — M5412 Radiculopathy, cervical region: Secondary | ICD-10-CM | POA: Diagnosis not present

## 2020-05-26 DIAGNOSIS — M542 Cervicalgia: Secondary | ICD-10-CM | POA: Diagnosis not present

## 2020-05-26 DIAGNOSIS — M5412 Radiculopathy, cervical region: Secondary | ICD-10-CM | POA: Diagnosis not present

## 2020-05-26 DIAGNOSIS — M25612 Stiffness of left shoulder, not elsewhere classified: Secondary | ICD-10-CM | POA: Diagnosis not present

## 2020-05-26 DIAGNOSIS — M25512 Pain in left shoulder: Secondary | ICD-10-CM | POA: Diagnosis not present

## 2020-05-29 DIAGNOSIS — M5412 Radiculopathy, cervical region: Secondary | ICD-10-CM | POA: Diagnosis not present

## 2020-05-29 DIAGNOSIS — M542 Cervicalgia: Secondary | ICD-10-CM | POA: Diagnosis not present

## 2020-05-29 DIAGNOSIS — M25612 Stiffness of left shoulder, not elsewhere classified: Secondary | ICD-10-CM | POA: Diagnosis not present

## 2020-05-29 DIAGNOSIS — M25512 Pain in left shoulder: Secondary | ICD-10-CM | POA: Diagnosis not present

## 2020-05-31 DIAGNOSIS — M25612 Stiffness of left shoulder, not elsewhere classified: Secondary | ICD-10-CM | POA: Diagnosis not present

## 2020-05-31 DIAGNOSIS — M542 Cervicalgia: Secondary | ICD-10-CM | POA: Diagnosis not present

## 2020-05-31 DIAGNOSIS — M5412 Radiculopathy, cervical region: Secondary | ICD-10-CM | POA: Diagnosis not present

## 2020-05-31 DIAGNOSIS — M25512 Pain in left shoulder: Secondary | ICD-10-CM | POA: Diagnosis not present

## 2020-06-02 DIAGNOSIS — M25512 Pain in left shoulder: Secondary | ICD-10-CM | POA: Diagnosis not present

## 2020-06-02 DIAGNOSIS — M25612 Stiffness of left shoulder, not elsewhere classified: Secondary | ICD-10-CM | POA: Diagnosis not present

## 2020-06-02 DIAGNOSIS — M5412 Radiculopathy, cervical region: Secondary | ICD-10-CM | POA: Diagnosis not present

## 2020-06-02 DIAGNOSIS — M542 Cervicalgia: Secondary | ICD-10-CM | POA: Diagnosis not present

## 2020-06-05 DIAGNOSIS — M5412 Radiculopathy, cervical region: Secondary | ICD-10-CM | POA: Diagnosis not present

## 2020-06-05 DIAGNOSIS — M25612 Stiffness of left shoulder, not elsewhere classified: Secondary | ICD-10-CM | POA: Diagnosis not present

## 2020-06-05 DIAGNOSIS — M542 Cervicalgia: Secondary | ICD-10-CM | POA: Diagnosis not present

## 2020-06-05 DIAGNOSIS — M25512 Pain in left shoulder: Secondary | ICD-10-CM | POA: Diagnosis not present

## 2020-06-07 DIAGNOSIS — M25512 Pain in left shoulder: Secondary | ICD-10-CM | POA: Diagnosis not present

## 2020-06-07 DIAGNOSIS — M25612 Stiffness of left shoulder, not elsewhere classified: Secondary | ICD-10-CM | POA: Diagnosis not present

## 2020-06-07 DIAGNOSIS — M542 Cervicalgia: Secondary | ICD-10-CM | POA: Diagnosis not present

## 2020-06-07 DIAGNOSIS — M5412 Radiculopathy, cervical region: Secondary | ICD-10-CM | POA: Diagnosis not present

## 2020-06-12 DIAGNOSIS — M25612 Stiffness of left shoulder, not elsewhere classified: Secondary | ICD-10-CM | POA: Diagnosis not present

## 2020-06-12 DIAGNOSIS — M542 Cervicalgia: Secondary | ICD-10-CM | POA: Diagnosis not present

## 2020-06-12 DIAGNOSIS — E6609 Other obesity due to excess calories: Secondary | ICD-10-CM | POA: Diagnosis not present

## 2020-06-12 DIAGNOSIS — I10 Essential (primary) hypertension: Secondary | ICD-10-CM | POA: Diagnosis not present

## 2020-06-12 DIAGNOSIS — M25512 Pain in left shoulder: Secondary | ICD-10-CM | POA: Diagnosis not present

## 2020-06-12 DIAGNOSIS — Z0000 Encounter for general adult medical examination without abnormal findings: Secondary | ICD-10-CM | POA: Diagnosis not present

## 2020-06-12 DIAGNOSIS — G8929 Other chronic pain: Secondary | ICD-10-CM | POA: Diagnosis not present

## 2020-06-12 DIAGNOSIS — M5412 Radiculopathy, cervical region: Secondary | ICD-10-CM | POA: Diagnosis not present

## 2020-06-12 DIAGNOSIS — Z6830 Body mass index (BMI) 30.0-30.9, adult: Secondary | ICD-10-CM | POA: Diagnosis not present

## 2020-06-14 DIAGNOSIS — M25612 Stiffness of left shoulder, not elsewhere classified: Secondary | ICD-10-CM | POA: Diagnosis not present

## 2020-06-14 DIAGNOSIS — M542 Cervicalgia: Secondary | ICD-10-CM | POA: Diagnosis not present

## 2020-06-14 DIAGNOSIS — M25512 Pain in left shoulder: Secondary | ICD-10-CM | POA: Diagnosis not present

## 2020-06-14 DIAGNOSIS — M5412 Radiculopathy, cervical region: Secondary | ICD-10-CM | POA: Diagnosis not present

## 2020-06-20 DIAGNOSIS — M542 Cervicalgia: Secondary | ICD-10-CM | POA: Diagnosis not present

## 2020-06-20 DIAGNOSIS — M25612 Stiffness of left shoulder, not elsewhere classified: Secondary | ICD-10-CM | POA: Diagnosis not present

## 2020-06-20 DIAGNOSIS — M5412 Radiculopathy, cervical region: Secondary | ICD-10-CM | POA: Diagnosis not present

## 2020-06-20 DIAGNOSIS — M25512 Pain in left shoulder: Secondary | ICD-10-CM | POA: Diagnosis not present

## 2020-06-22 DIAGNOSIS — M25512 Pain in left shoulder: Secondary | ICD-10-CM | POA: Diagnosis not present

## 2020-06-22 DIAGNOSIS — M542 Cervicalgia: Secondary | ICD-10-CM | POA: Diagnosis not present

## 2020-06-22 DIAGNOSIS — M5412 Radiculopathy, cervical region: Secondary | ICD-10-CM | POA: Diagnosis not present

## 2020-06-22 DIAGNOSIS — M25612 Stiffness of left shoulder, not elsewhere classified: Secondary | ICD-10-CM | POA: Diagnosis not present

## 2020-06-27 DIAGNOSIS — M5412 Radiculopathy, cervical region: Secondary | ICD-10-CM | POA: Diagnosis not present

## 2020-06-27 DIAGNOSIS — M25512 Pain in left shoulder: Secondary | ICD-10-CM | POA: Diagnosis not present

## 2020-06-27 DIAGNOSIS — M542 Cervicalgia: Secondary | ICD-10-CM | POA: Diagnosis not present

## 2020-06-27 DIAGNOSIS — M25612 Stiffness of left shoulder, not elsewhere classified: Secondary | ICD-10-CM | POA: Diagnosis not present

## 2020-06-28 DIAGNOSIS — Z9989 Dependence on other enabling machines and devices: Secondary | ICD-10-CM | POA: Diagnosis not present

## 2020-06-28 DIAGNOSIS — Z794 Long term (current) use of insulin: Secondary | ICD-10-CM | POA: Diagnosis not present

## 2020-06-28 DIAGNOSIS — E7800 Pure hypercholesterolemia, unspecified: Secondary | ICD-10-CM | POA: Diagnosis not present

## 2020-06-28 DIAGNOSIS — G4733 Obstructive sleep apnea (adult) (pediatric): Secondary | ICD-10-CM | POA: Diagnosis not present

## 2020-06-28 DIAGNOSIS — I10 Essential (primary) hypertension: Secondary | ICD-10-CM | POA: Diagnosis not present

## 2020-06-28 DIAGNOSIS — E1142 Type 2 diabetes mellitus with diabetic polyneuropathy: Secondary | ICD-10-CM | POA: Diagnosis not present

## 2020-06-28 DIAGNOSIS — R5381 Other malaise: Secondary | ICD-10-CM | POA: Diagnosis not present

## 2020-06-28 DIAGNOSIS — I25810 Atherosclerosis of coronary artery bypass graft(s) without angina pectoris: Secondary | ICD-10-CM | POA: Diagnosis not present

## 2020-06-29 DIAGNOSIS — M25512 Pain in left shoulder: Secondary | ICD-10-CM | POA: Diagnosis not present

## 2020-06-29 DIAGNOSIS — M25612 Stiffness of left shoulder, not elsewhere classified: Secondary | ICD-10-CM | POA: Diagnosis not present

## 2020-06-29 DIAGNOSIS — M542 Cervicalgia: Secondary | ICD-10-CM | POA: Diagnosis not present

## 2020-06-29 DIAGNOSIS — M5412 Radiculopathy, cervical region: Secondary | ICD-10-CM | POA: Diagnosis not present

## 2020-07-03 DIAGNOSIS — M4722 Other spondylosis with radiculopathy, cervical region: Secondary | ICD-10-CM | POA: Diagnosis not present

## 2020-07-03 DIAGNOSIS — M7542 Impingement syndrome of left shoulder: Secondary | ICD-10-CM | POA: Diagnosis not present

## 2020-07-07 DIAGNOSIS — M25612 Stiffness of left shoulder, not elsewhere classified: Secondary | ICD-10-CM | POA: Diagnosis not present

## 2020-07-07 DIAGNOSIS — M25512 Pain in left shoulder: Secondary | ICD-10-CM | POA: Diagnosis not present

## 2020-07-07 DIAGNOSIS — M5412 Radiculopathy, cervical region: Secondary | ICD-10-CM | POA: Diagnosis not present

## 2020-07-07 DIAGNOSIS — M542 Cervicalgia: Secondary | ICD-10-CM | POA: Diagnosis not present

## 2020-07-11 DIAGNOSIS — M5412 Radiculopathy, cervical region: Secondary | ICD-10-CM | POA: Diagnosis not present

## 2020-07-12 DIAGNOSIS — M542 Cervicalgia: Secondary | ICD-10-CM | POA: Diagnosis not present

## 2020-07-12 DIAGNOSIS — M25612 Stiffness of left shoulder, not elsewhere classified: Secondary | ICD-10-CM | POA: Diagnosis not present

## 2020-07-12 DIAGNOSIS — M25512 Pain in left shoulder: Secondary | ICD-10-CM | POA: Diagnosis not present

## 2020-07-12 DIAGNOSIS — M5412 Radiculopathy, cervical region: Secondary | ICD-10-CM | POA: Diagnosis not present

## 2020-07-13 DIAGNOSIS — G542 Cervical root disorders, not elsewhere classified: Secondary | ICD-10-CM | POA: Diagnosis not present

## 2020-07-13 DIAGNOSIS — Z794 Long term (current) use of insulin: Secondary | ICD-10-CM | POA: Diagnosis not present

## 2020-07-13 DIAGNOSIS — E7800 Pure hypercholesterolemia, unspecified: Secondary | ICD-10-CM | POA: Diagnosis not present

## 2020-07-13 DIAGNOSIS — I10 Essential (primary) hypertension: Secondary | ICD-10-CM | POA: Diagnosis not present

## 2020-07-13 DIAGNOSIS — E1142 Type 2 diabetes mellitus with diabetic polyneuropathy: Secondary | ICD-10-CM | POA: Diagnosis not present

## 2020-07-19 DIAGNOSIS — M5412 Radiculopathy, cervical region: Secondary | ICD-10-CM | POA: Diagnosis not present

## 2020-07-19 DIAGNOSIS — M25612 Stiffness of left shoulder, not elsewhere classified: Secondary | ICD-10-CM | POA: Diagnosis not present

## 2020-07-19 DIAGNOSIS — M542 Cervicalgia: Secondary | ICD-10-CM | POA: Diagnosis not present

## 2020-07-19 DIAGNOSIS — M25512 Pain in left shoulder: Secondary | ICD-10-CM | POA: Diagnosis not present

## 2020-07-25 ENCOUNTER — Telehealth: Payer: Self-pay | Admitting: Cardiovascular Disease

## 2020-07-25 DIAGNOSIS — M5412 Radiculopathy, cervical region: Secondary | ICD-10-CM | POA: Diagnosis not present

## 2020-07-25 DIAGNOSIS — M542 Cervicalgia: Secondary | ICD-10-CM | POA: Diagnosis not present

## 2020-07-25 DIAGNOSIS — M25612 Stiffness of left shoulder, not elsewhere classified: Secondary | ICD-10-CM | POA: Diagnosis not present

## 2020-07-25 DIAGNOSIS — M25512 Pain in left shoulder: Secondary | ICD-10-CM | POA: Diagnosis not present

## 2020-07-25 NOTE — Telephone Encounter (Signed)
*  STAT* If patient is at the pharmacy, call can be transferred to refill team.   1. Which medications need to be refilled? (please list name of each medication and dose if known) patient states he need some refills and do not what he need  2. Which pharmacy/location (including street and city if local pharmacy) is medication to be sent to? [facility] [place]  3. Do they need a 30 day or 90 day supply? Not sure

## 2020-07-31 DIAGNOSIS — M25612 Stiffness of left shoulder, not elsewhere classified: Secondary | ICD-10-CM | POA: Diagnosis not present

## 2020-07-31 DIAGNOSIS — M25512 Pain in left shoulder: Secondary | ICD-10-CM | POA: Diagnosis not present

## 2020-07-31 DIAGNOSIS — M5412 Radiculopathy, cervical region: Secondary | ICD-10-CM | POA: Diagnosis not present

## 2020-07-31 DIAGNOSIS — M542 Cervicalgia: Secondary | ICD-10-CM | POA: Diagnosis not present

## 2020-08-01 DIAGNOSIS — M7542 Impingement syndrome of left shoulder: Secondary | ICD-10-CM | POA: Diagnosis not present

## 2020-08-02 ENCOUNTER — Telehealth: Payer: Self-pay

## 2020-08-02 NOTE — Telephone Encounter (Signed)
Per Dr. Claiborne Billings, called to offer patient an in-office appointment on August 11, 2020 as opposed to a virtual visit. Dr. Claiborne Billings will be on site that day and would be happy to see the pt in person.   Left Message for pt to call back and let us know his preference for office versus virtual.

## 2020-08-11 ENCOUNTER — Encounter: Payer: Self-pay | Admitting: Cardiovascular Disease

## 2020-08-11 ENCOUNTER — Other Ambulatory Visit: Payer: Self-pay

## 2020-08-11 ENCOUNTER — Telehealth: Payer: Self-pay

## 2020-08-11 ENCOUNTER — Telehealth (INDEPENDENT_AMBULATORY_CARE_PROVIDER_SITE_OTHER): Payer: PPO | Admitting: Cardiovascular Disease

## 2020-08-11 VITALS — BP 181/89 | HR 92

## 2020-08-11 DIAGNOSIS — E785 Hyperlipidemia, unspecified: Secondary | ICD-10-CM

## 2020-08-11 DIAGNOSIS — I10 Essential (primary) hypertension: Secondary | ICD-10-CM

## 2020-08-11 DIAGNOSIS — Z79899 Other long term (current) drug therapy: Secondary | ICD-10-CM | POA: Diagnosis not present

## 2020-08-11 MED ORDER — CARVEDILOL 25 MG PO TABS: 25.0000 mg | ORAL_TABLET | Freq: Two times a day (BID) | ORAL | 3 refills | Status: DC

## 2020-08-11 MED ORDER — ROSUVASTATIN CALCIUM 20 MG PO TABS: 20.0000 mg | ORAL_TABLET | Freq: Every day | ORAL | 3 refills | Status: DC

## 2020-08-11 NOTE — Patient Instructions (Signed)
Medication Instructions:  STOP taking pravastatin START taking rosuvastatin (Crestor) 20 mg by mouth once a day INCREASE your carvedilol (Coreg) to 25 mg by mouth TWICE A DAY *If you need a refill on your cardiac medications before your next appointment, please call your pharmacy*   Lab Work: CMET, CBC, TSH, and LIPID PROFILE - FASTING in 3 MONTHS If you have labs (blood work) drawn today and your tests are completely normal, you will receive your results only by:  [place] (if you have MyChart) OR  A paper copy in the mail If you have any lab test that is abnormal or we need to change your treatment, we will call you to review the results.   Testing/Procedures: None ordered   Follow-Up: At [facility], you and your health needs are our priority.  As part of our continuing mission to provide you with exceptional heart care, we have created designated Provider Care Teams.  These Care Teams include your primary Cardiologist (physician) and Advanced Practice Providers (APPs -  Physician Assistants and Nurse Practitioners) who all work together to provide you with the care you need, when you need it.  We recommend signing up for the patient portal called "MyChart".  Sign up information is provided on this After Visit Summary.  MyChart is used to connect with patients for Virtual Visits (Telemedicine).  Patients are able to view lab/test results, encounter notes, upcoming appointments, etc.  Non-urgent messages can be sent to your provider as well.   To learn more about what you can do with MyChart, go to [email or web address].    Your next appointment:   4 month(s)  The format for your next appointment:   In Person  Provider:   Shelva Majestic, MD   Other Instructions None
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
## 2020-08-11 NOTE — Telephone Encounter (Signed)
2nd attempt trying to reach patient for virtual visit. Left message to call back.

## 2020-08-11 NOTE — Telephone Encounter (Signed)
Left message for patient to call back to go over AVS instructions.

## 2020-08-11 NOTE — Telephone Encounter (Signed)
Left message to offer in-office appointment as opposed to virtual for today, 08/11/20 at 0920.

## 2020-08-14 ENCOUNTER — Telehealth: Payer: Self-pay | Admitting: Cardiovascular Disease

## 2020-08-14 NOTE — Telephone Encounter (Signed)
Left message to call back  

## 2020-08-14 NOTE — Telephone Encounter (Signed)
Patient expressing concern about the rosuvastatin (CRESTOR) 20 MG tablet. Michela Pitcher that he was having difficulty taking the medication before. Please call to discuss medication with patient

## 2020-08-15 NOTE — Telephone Encounter (Signed)
Left message for pt to call.

## 2020-08-15 NOTE — Telephone Encounter (Signed)
Follow up   Pt is returning call to [facility]   Call transferred To triage

## 2020-08-15 NOTE — Telephone Encounter (Signed)
Patient returning a call from our office 

## 2020-08-15 NOTE — Telephone Encounter (Signed)
Left a message for the patient to call back.  

## 2020-08-15 NOTE — Telephone Encounter (Signed)
Pt called to discuss crestor.  Pt states that he is unable to take crestor.  Pt states that he saw a Dr. Owens Shark in [place] for a brief time and he switched the pt to pravastatin 40mg .  Pt states that he is doing well with this medication and would like to continue pravastatin as cholesterol treatment. Changed pt's med list to reflect this.  Pt states that he is taking all other medications as prescribed and that he has not missed any doses. Pt states that he would like for Dr. Claiborne Billings to be aware of the change in medication.

## 2020-08-21 NOTE — Telephone Encounter (Signed)
Thank you for letting me know.  If he is tolerating pravastatin, continue as prescribed.

## 2020-08-30 DIAGNOSIS — H2512 Age-related nuclear cataract, left eye: Secondary | ICD-10-CM | POA: Diagnosis not present

## 2020-08-30 DIAGNOSIS — I10 Essential (primary) hypertension: Secondary | ICD-10-CM | POA: Diagnosis not present

## 2020-08-30 DIAGNOSIS — E119 Type 2 diabetes mellitus without complications: Secondary | ICD-10-CM | POA: Diagnosis not present

## 2020-08-30 DIAGNOSIS — H2513 Age-related nuclear cataract, bilateral: Secondary | ICD-10-CM | POA: Diagnosis not present

## 2020-09-04 ENCOUNTER — Telehealth: Payer: Self-pay | Admitting: Cardiovascular Disease

## 2020-09-04 NOTE — Telephone Encounter (Signed)
Patient states he is completely out of medium cushions for his CPAP. However, he is not able to afford purchasing replacements. He would like to know if we are able to provide 3-4 cushions for his CPAP. Please advise.

## 2020-09-07 ENCOUNTER — Telehealth: Payer: Self-pay | Admitting: Cardiovascular Disease

## 2020-09-07 DIAGNOSIS — G4733 Obstructive sleep apnea (adult) (pediatric): Secondary | ICD-10-CM | POA: Diagnosis not present

## 2020-09-07 NOTE — Telephone Encounter (Signed)
Called to speak with patient. He states he has had a conversation with [employer] today about getting new  cushions for his CPAP mask. He states they confirmed that they would be shipping him a new set of cushions within a few days. Advised pt that [employer] would contact us directly if they required any documentation from our office related to his supply prescriptions. Advised pt to contact [employer] directly if he has any further questions about this delivery. The patient verbalizes understanding and agreement with plan.

## 2020-09-07 NOTE — Telephone Encounter (Signed)
Signed order for cpap supplies/ cushions sent to [facility]..Patient notified.

## 2020-09-07 NOTE — Telephone Encounter (Signed)
The patient wanted to make sure all of his orders were current for supplies for his CPAP Machine. He needs more cushions for his CPAP Machine but was not sure when the last time Dr. Claiborne Billings put an order in his chart. He has contacted [place] for the supplies but does not want to risk not getting the necessary supplies. He states that the cushions he uses now are worn out.  Please make sure all orders are up to date.

## 2020-10-10 DIAGNOSIS — Z125 Encounter for screening for malignant neoplasm of prostate: Secondary | ICD-10-CM | POA: Diagnosis not present

## 2020-10-10 DIAGNOSIS — E7800 Pure hypercholesterolemia, unspecified: Secondary | ICD-10-CM | POA: Diagnosis not present

## 2020-10-10 DIAGNOSIS — F22 Delusional disorders: Secondary | ICD-10-CM | POA: Diagnosis not present

## 2020-10-10 DIAGNOSIS — R5381 Other malaise: Secondary | ICD-10-CM | POA: Diagnosis not present

## 2020-10-10 DIAGNOSIS — E1142 Type 2 diabetes mellitus with diabetic polyneuropathy: Secondary | ICD-10-CM | POA: Diagnosis not present

## 2020-10-10 DIAGNOSIS — Z794 Long term (current) use of insulin: Secondary | ICD-10-CM | POA: Diagnosis not present

## 2020-10-10 DIAGNOSIS — I10 Essential (primary) hypertension: Secondary | ICD-10-CM | POA: Diagnosis not present

## 2020-10-10 DIAGNOSIS — I25810 Atherosclerosis of coronary artery bypass graft(s) without angina pectoris: Secondary | ICD-10-CM | POA: Diagnosis not present

## 2020-10-16 DIAGNOSIS — Z794 Long term (current) use of insulin: Secondary | ICD-10-CM | POA: Diagnosis not present

## 2020-10-16 DIAGNOSIS — Z125 Encounter for screening for malignant neoplasm of prostate: Secondary | ICD-10-CM | POA: Diagnosis not present

## 2020-10-16 DIAGNOSIS — E1142 Type 2 diabetes mellitus with diabetic polyneuropathy: Secondary | ICD-10-CM | POA: Diagnosis not present

## 2020-10-16 DIAGNOSIS — I10 Essential (primary) hypertension: Secondary | ICD-10-CM | POA: Diagnosis not present

## 2020-10-16 DIAGNOSIS — R5381 Other malaise: Secondary | ICD-10-CM | POA: Diagnosis not present

## 2020-10-16 DIAGNOSIS — E7800 Pure hypercholesterolemia, unspecified: Secondary | ICD-10-CM | POA: Diagnosis not present

## 2021-02-13 DIAGNOSIS — Z794 Long term (current) use of insulin: Secondary | ICD-10-CM | POA: Diagnosis not present

## 2021-02-13 DIAGNOSIS — Z9989 Dependence on other enabling machines and devices: Secondary | ICD-10-CM | POA: Diagnosis not present

## 2021-02-13 DIAGNOSIS — M1711 Unilateral primary osteoarthritis, right knee: Secondary | ICD-10-CM | POA: Diagnosis not present

## 2021-02-13 DIAGNOSIS — E1142 Type 2 diabetes mellitus with diabetic polyneuropathy: Secondary | ICD-10-CM | POA: Diagnosis not present

## 2021-02-13 DIAGNOSIS — E7800 Pure hypercholesterolemia, unspecified: Secondary | ICD-10-CM | POA: Diagnosis not present

## 2021-02-13 DIAGNOSIS — I10 Essential (primary) hypertension: Secondary | ICD-10-CM | POA: Diagnosis not present

## 2021-02-13 DIAGNOSIS — G4733 Obstructive sleep apnea (adult) (pediatric): Secondary | ICD-10-CM | POA: Diagnosis not present

## 2021-02-13 DIAGNOSIS — I25810 Atherosclerosis of coronary artery bypass graft(s) without angina pectoris: Secondary | ICD-10-CM | POA: Diagnosis not present

## 2021-03-28 ENCOUNTER — Telehealth: Payer: Self-pay | Admitting: *Deleted

## 2021-03-28 DIAGNOSIS — I10 Essential (primary) hypertension: Secondary | ICD-10-CM | POA: Diagnosis not present

## 2021-03-28 DIAGNOSIS — S0993XS Unspecified injury of face, sequela: Secondary | ICD-10-CM | POA: Diagnosis not present

## 2021-03-28 DIAGNOSIS — H2512 Age-related nuclear cataract, left eye: Secondary | ICD-10-CM | POA: Diagnosis not present

## 2021-03-28 DIAGNOSIS — H2513 Age-related nuclear cataract, bilateral: Secondary | ICD-10-CM | POA: Diagnosis not present

## 2021-03-28 DIAGNOSIS — E119 Type 2 diabetes mellitus without complications: Secondary | ICD-10-CM | POA: Diagnosis not present

## 2021-03-28 NOTE — Telephone Encounter (Signed)
   [facility] Pre-operative Risk Assessment    Patient Name: Brandon Herrera  DOB: Jun 12, 1946 MRN: [id number]  HEARTCARE STAFF:  - IMPORTANT!!!!!! Under Visit Info/Reason for Call, type in Other and utilize the format Clearance MM/DD/YY or Clearance TBD. Do not use dashes or single digits. - Please review there is not already an duplicate clearance open for this procedure. - If request is for dental extraction, please clarify the # of teeth to be extracted. - If the patient is currently at the dentist's office, call Pre-Op Callback Staff (MA/nurse) to input urgent request.  - If the patient is not currently in the dentist office, please route to the Pre-Op pool.  Request for surgical clearance:  What type of surgery is being performed? CATARACT EXTRACTION WITH INTRAOCULAR LENS IMPLANTATION   When is this surgery scheduled? 04/10/2021 AND 04/24/2021   What type of clearance is required (medical clearance vs. Pharmacy clearance to hold med vs. Both)? MEDICAL   Are there any medications that need to be held prior to surgery and how long?    Practice name and name of physician performing surgery? [facility]  What is the office phone number? [phone]   7.   What is the office fax number? [phone]  8.   Anesthesia type (None, local, MAC, general) ?

## 2021-03-29 NOTE — Telephone Encounter (Signed)
   Patient Name: Brandon Herrera  DOB: 07-10-1946 MRN: [id number]  Primary Cardiologist: Shelva Majestic, MD  Chart reviewed as part of pre-operative protocol coverage. Cataract extractions are recognized in guidelines as low risk surgeries that do not typically require specific preoperative testing or holding of blood thinner therapy. Therefore, given past medical history and time since last visit, based on ACC/AHA guidelines, KOURY ZAMORA would be at acceptable risk for the planned procedure without further cardiovascular testing.   I will route this recommendation to the requesting party via Epic fax function and remove from pre-op pool.  Please call with questions.  Kathyrn Drown, NP 03/29/2021, 8:54 AM

## 2021-04-10 DIAGNOSIS — H25812 Combined forms of age-related cataract, left eye: Secondary | ICD-10-CM | POA: Diagnosis not present

## 2021-04-10 DIAGNOSIS — G4733 Obstructive sleep apnea (adult) (pediatric): Secondary | ICD-10-CM | POA: Diagnosis not present

## 2021-04-10 DIAGNOSIS — I252 Old myocardial infarction: Secondary | ICD-10-CM | POA: Diagnosis not present

## 2021-04-10 DIAGNOSIS — Z7902 Long term (current) use of antithrombotics/antiplatelets: Secondary | ICD-10-CM | POA: Diagnosis not present

## 2021-04-10 DIAGNOSIS — Z794 Long term (current) use of insulin: Secondary | ICD-10-CM | POA: Diagnosis not present

## 2021-04-10 DIAGNOSIS — Z87891 Personal history of nicotine dependence: Secondary | ICD-10-CM | POA: Diagnosis not present

## 2021-04-10 DIAGNOSIS — I10 Essential (primary) hypertension: Secondary | ICD-10-CM | POA: Diagnosis not present

## 2021-04-10 DIAGNOSIS — F329 Major depressive disorder, single episode, unspecified: Secondary | ICD-10-CM | POA: Diagnosis not present

## 2021-04-10 DIAGNOSIS — Z6832 Body mass index (BMI) 32.0-32.9, adult: Secondary | ICD-10-CM | POA: Diagnosis not present

## 2021-04-10 DIAGNOSIS — E669 Obesity, unspecified: Secondary | ICD-10-CM | POA: Diagnosis not present

## 2021-04-10 DIAGNOSIS — Z79899 Other long term (current) drug therapy: Secondary | ICD-10-CM | POA: Diagnosis not present

## 2021-04-10 DIAGNOSIS — K219 Gastro-esophageal reflux disease without esophagitis: Secondary | ICD-10-CM | POA: Diagnosis not present

## 2021-04-10 DIAGNOSIS — H2512 Age-related nuclear cataract, left eye: Secondary | ICD-10-CM | POA: Diagnosis not present

## 2021-04-10 DIAGNOSIS — I2510 Atherosclerotic heart disease of native coronary artery without angina pectoris: Secondary | ICD-10-CM | POA: Diagnosis not present

## 2021-04-10 DIAGNOSIS — Z91040 Latex allergy status: Secondary | ICD-10-CM | POA: Diagnosis not present

## 2021-04-10 DIAGNOSIS — E1136 Type 2 diabetes mellitus with diabetic cataract: Secondary | ICD-10-CM | POA: Diagnosis not present

## 2021-04-10 DIAGNOSIS — E785 Hyperlipidemia, unspecified: Secondary | ICD-10-CM | POA: Diagnosis not present

## 2021-04-10 DIAGNOSIS — Z951 Presence of aortocoronary bypass graft: Secondary | ICD-10-CM | POA: Diagnosis not present

## 2021-04-11 DIAGNOSIS — H25011 Cortical age-related cataract, right eye: Secondary | ICD-10-CM | POA: Diagnosis not present

## 2021-04-11 DIAGNOSIS — H25041 Posterior subcapsular polar age-related cataract, right eye: Secondary | ICD-10-CM | POA: Diagnosis not present

## 2021-04-11 DIAGNOSIS — H2511 Age-related nuclear cataract, right eye: Secondary | ICD-10-CM | POA: Diagnosis not present

## 2021-04-24 DIAGNOSIS — E1136 Type 2 diabetes mellitus with diabetic cataract: Secondary | ICD-10-CM | POA: Diagnosis not present

## 2021-04-24 DIAGNOSIS — E785 Hyperlipidemia, unspecified: Secondary | ICD-10-CM | POA: Diagnosis not present

## 2021-04-24 DIAGNOSIS — Z87891 Personal history of nicotine dependence: Secondary | ICD-10-CM | POA: Diagnosis not present

## 2021-04-24 DIAGNOSIS — G4733 Obstructive sleep apnea (adult) (pediatric): Secondary | ICD-10-CM | POA: Diagnosis not present

## 2021-04-24 DIAGNOSIS — I10 Essential (primary) hypertension: Secondary | ICD-10-CM | POA: Diagnosis not present

## 2021-04-24 DIAGNOSIS — Z6832 Body mass index (BMI) 32.0-32.9, adult: Secondary | ICD-10-CM | POA: Diagnosis not present

## 2021-04-24 DIAGNOSIS — Z955 Presence of coronary angioplasty implant and graft: Secondary | ICD-10-CM | POA: Diagnosis not present

## 2021-04-24 DIAGNOSIS — E1140 Type 2 diabetes mellitus with diabetic neuropathy, unspecified: Secondary | ICD-10-CM | POA: Diagnosis not present

## 2021-04-24 DIAGNOSIS — Z888 Allergy status to other drugs, medicaments and biological substances status: Secondary | ICD-10-CM | POA: Diagnosis not present

## 2021-04-24 DIAGNOSIS — Z951 Presence of aortocoronary bypass graft: Secondary | ICD-10-CM | POA: Diagnosis not present

## 2021-04-24 DIAGNOSIS — Z79899 Other long term (current) drug therapy: Secondary | ICD-10-CM | POA: Diagnosis not present

## 2021-04-24 DIAGNOSIS — E669 Obesity, unspecified: Secondary | ICD-10-CM | POA: Diagnosis not present

## 2021-04-24 DIAGNOSIS — K219 Gastro-esophageal reflux disease without esophagitis: Secondary | ICD-10-CM | POA: Diagnosis not present

## 2021-04-24 DIAGNOSIS — I2510 Atherosclerotic heart disease of native coronary artery without angina pectoris: Secondary | ICD-10-CM | POA: Diagnosis not present

## 2021-04-24 DIAGNOSIS — I252 Old myocardial infarction: Secondary | ICD-10-CM | POA: Diagnosis not present

## 2021-04-24 DIAGNOSIS — Z794 Long term (current) use of insulin: Secondary | ICD-10-CM | POA: Diagnosis not present

## 2021-04-24 DIAGNOSIS — Z7901 Long term (current) use of anticoagulants: Secondary | ICD-10-CM | POA: Diagnosis not present

## 2021-04-24 DIAGNOSIS — H2511 Age-related nuclear cataract, right eye: Secondary | ICD-10-CM | POA: Diagnosis not present

## 2021-04-24 DIAGNOSIS — Z7982 Long term (current) use of aspirin: Secondary | ICD-10-CM | POA: Diagnosis not present

## 2021-04-24 DIAGNOSIS — F329 Major depressive disorder, single episode, unspecified: Secondary | ICD-10-CM | POA: Diagnosis not present

## 2021-04-24 DIAGNOSIS — E119 Type 2 diabetes mellitus without complications: Secondary | ICD-10-CM | POA: Diagnosis not present

## 2021-05-15 DIAGNOSIS — E1142 Type 2 diabetes mellitus with diabetic polyneuropathy: Secondary | ICD-10-CM | POA: Diagnosis not present

## 2021-05-15 DIAGNOSIS — I10 Essential (primary) hypertension: Secondary | ICD-10-CM | POA: Diagnosis not present

## 2021-05-15 DIAGNOSIS — E7800 Pure hypercholesterolemia, unspecified: Secondary | ICD-10-CM | POA: Diagnosis not present

## 2021-05-15 DIAGNOSIS — G4733 Obstructive sleep apnea (adult) (pediatric): Secondary | ICD-10-CM | POA: Diagnosis not present

## 2021-05-15 DIAGNOSIS — Z9989 Dependence on other enabling machines and devices: Secondary | ICD-10-CM | POA: Diagnosis not present

## 2021-05-15 DIAGNOSIS — Z794 Long term (current) use of insulin: Secondary | ICD-10-CM | POA: Diagnosis not present

## 2021-05-15 DIAGNOSIS — I25810 Atherosclerosis of coronary artery bypass graft(s) without angina pectoris: Secondary | ICD-10-CM | POA: Diagnosis not present

## 2021-05-16 ENCOUNTER — Telehealth: Payer: Self-pay | Admitting: Cardiovascular Disease

## 2021-05-16 NOTE — Telephone Encounter (Signed)
What problem are you experiencing? Patient has questions about his CPAP, he needs to know how often to make changes to it.   Who is your medical equipment company? [employer]   Please route to the sleep study assistant.

## 2021-05-28 DIAGNOSIS — G4733 Obstructive sleep apnea (adult) (pediatric): Secondary | ICD-10-CM | POA: Diagnosis not present

## 2021-06-12 DIAGNOSIS — J069 Acute upper respiratory infection, unspecified: Secondary | ICD-10-CM | POA: Diagnosis not present

## 2021-06-12 DIAGNOSIS — G4733 Obstructive sleep apnea (adult) (pediatric): Secondary | ICD-10-CM | POA: Diagnosis not present

## 2022-12-09 ENCOUNTER — Telehealth: Payer: Self-pay | Admitting: Cardiovascular Disease

## 2022-12-09 NOTE — Telephone Encounter (Signed)
Pt states he is getting very little air pressure in his CPAP machine, please advise.

## 2023-01-15 ENCOUNTER — Telehealth: Payer: Self-pay | Admitting: *Deleted

## 2023-01-15 NOTE — Telephone Encounter (Signed)
   Name: Brandon Herrera  DOB: 1946-07-02  MRN: [id number]  Primary Cardiologist: Nicki Guadalajara, MD  Chart reviewed as part of pre-operative protocol coverage. Because of Brandon Herrera's past medical history and time since last visit, he will require a follow-up in-office visit in order to better assess preoperative cardiovascular risk.  Pre-op covering staff: - Please schedule appointment and call patient to inform them. If patient already had an upcoming appointment within acceptable timeframe, please add "pre-op clearance" to the appointment notes so provider is aware. - Please contact requesting surgeon's office via preferred method (i.e, phone, fax) to inform them of need for appointment prior to surgery.  Per office protocol, if patient is without any new symptoms or concerns at the time of their visit, he may hold Plavix for 5 days prior to procedure. Please resume Plavix as soon as possible postprocedure, at the discretion of the surgeon.   Regarding ASA therapy, we recommend continuation of ASA throughout the perioperative period.  However, if the surgeon feels that cessation of ASA is required in the perioperative period, it may be stopped 5-7 days prior to surgery with a plan to resume it as soon as felt to be feasible from a surgical standpoint in the post-operative period.  Joylene Grapes, NP  01/15/2023, 10:35 AM

## 2023-01-15 NOTE — Telephone Encounter (Signed)
   Pre-operative Risk Assessment    Patient Name: Brandon Herrera  DOB: 08/12/1946 MRN: [id number]      Request for Surgical Clearance    Procedure:   Sebaceous cyst .  Date of Surgery:  Clearance TBD                                 Surgeon:  Dr. Lars Masson Surgeon's Group or Practice Name:  [facility] Phone number:  [phone] Fax number:  [phone]   Type of Clearance Requested:   - Medical  - Pharmacy:  Hold Aspirin and Clopidogrel (Plavix) 1 week prior.   Type of Anesthesia:  Local    Additional requests/questions:    Signed, Emmit Pomfret   01/15/2023, 10:05 AM

## 2023-01-15 NOTE — Telephone Encounter (Signed)
Left message for the pt to call back and schedule an IN OFFICE appt for pre op clearance.  

## 2023-01-16 NOTE — Telephone Encounter (Signed)
Pt has been scheduled with Bernadene Person, NP 01/23/23 for pre op clearance. I will update all parties involved.

## 2023-01-23 ENCOUNTER — Encounter: Payer: Self-pay | Admitting: Nurse Practitioner

## 2023-01-23 ENCOUNTER — Telehealth: Payer: Self-pay | Admitting: Nurse Practitioner

## 2023-01-23 ENCOUNTER — Ambulatory Visit: Payer: PPO | Attending: Nurse Practitioner | Admitting: Nurse Practitioner

## 2023-01-23 VITALS — BP 130/74 | HR 83 | Ht 70.0 in | Wt 219.0 lb

## 2023-01-23 DIAGNOSIS — I255 Ischemic cardiomyopathy: Secondary | ICD-10-CM | POA: Diagnosis not present

## 2023-01-23 DIAGNOSIS — Z01810 Encounter for preprocedural cardiovascular examination: Secondary | ICD-10-CM

## 2023-01-23 DIAGNOSIS — I10 Essential (primary) hypertension: Secondary | ICD-10-CM

## 2023-01-23 DIAGNOSIS — I2510 Atherosclerotic heart disease of native coronary artery without angina pectoris: Secondary | ICD-10-CM

## 2023-01-23 DIAGNOSIS — E118 Type 2 diabetes mellitus with unspecified complications: Secondary | ICD-10-CM

## 2023-01-23 DIAGNOSIS — G4733 Obstructive sleep apnea (adult) (pediatric): Secondary | ICD-10-CM

## 2023-01-23 DIAGNOSIS — Z794 Long term (current) use of insulin: Secondary | ICD-10-CM

## 2023-01-23 DIAGNOSIS — E785 Hyperlipidemia, unspecified: Secondary | ICD-10-CM | POA: Diagnosis not present

## 2023-01-23 NOTE — Patient Instructions (Signed)
Medication Instructions:  Your physician recommends that you continue on your current medications as directed. Please refer to the Current Medication list given to you today.  *If you need a refill on your cardiac medications before your next appointment, please call your pharmacy*   Lab Work: NONE ordered at this time of appointment   If you have labs (blood work) drawn today and your tests are completely normal, you will receive your results only by: MyChart Message (if you have MyChart) OR A paper copy in the mail If you have any lab test that is abnormal or we need to change your treatment, we will call you to review the results.   Testing/Procedures: NONE ordered at this time of appointment     Follow-Up: At [facility], you and your health needs are our priority.  As part of our continuing mission to provide you with exceptional heart care, we have created designated Provider Care Teams.  These Care Teams include your primary Cardiologist (physician) and Advanced Practice Providers (APPs -  Physician Assistants and Nurse Practitioners) who all work together to provide you with the care you need, when you need it.  We recommend signing up for the patient portal called "MyChart".  Sign up information is provided on this After Visit Summary.  MyChart is used to connect with patients for Virtual Visits (Telemedicine).  Patients are able to view lab/test results, encounter notes, upcoming appointments, etc.  Non-urgent messages can be sent to your provider as well.   To learn more about what you can do with MyChart, go to [email or web address].    Your next appointment:    Keep follow up   Provider:   Nicki Guadalajara, MD     Other Instructions

## 2023-01-23 NOTE — Telephone Encounter (Signed)
Pt states he was told to to stop taking one of his cholesterol meds today but he doesn't remember which one. He states if he doesn't answer please call him back shortly or tomorrow, he does not wish to have vm left.

## 2023-01-23 NOTE — Telephone Encounter (Signed)
Patient states he was not splitting his Pravastatin 80 mg tablet to take 40 mg.  He states he has not split any pills so he is sure he is taking 80 mg daily.  Please advise if he should go to 40 mg or stay at 80 mg

## 2023-01-23 NOTE — Progress Notes (Signed)
Office Visit    Patient Name: MALCOMB ROELFS Date of Encounter: 01/23/2023  Primary Care Provider:  Mattie Marlin, MD Primary Cardiologist:  Nicki Guadalajara, MD  Chief Complaint    77 year old male with a history of CAD s/p PTCA-LCx in 1990, PTCA-eccentric calcified LAD in 1991, s/p intervention-LAD, LCx, and D1 in 2005, ICM,  hypertension, hyperlipidemia, OSA, type 2 diabetes, and prostate cancer who presents for follow-up related to CAD and for preoperative cardiac evaluation.  Past Medical History    Past Medical History:  Diagnosis Date   Abnormal nuclear stress test 03/03/2012   mod size inferior scar w/new anterolateral wall ischemia towards apex   CAD (coronary artery disease)    Diabetes mellitus (HCC)    Heart murmur    07/28/2008 ECHO: mild mitral annular ca+,AOV mildly sclerotic,mild LVH,mod.global hypokinesis,mild to mod post wall hypokinesis, EF 35-40%,LA mildly dilated   Hyperlipidemia    Hypertension    Inferior MI (HCC) 1985   totalled RCA   OSA on CPAP    Prostate cancer (HCC)    Seed implant   Past Surgical History:  Procedure Laterality Date   CARDIAC CATHETERIZATION  02/11/2003   patent CX stent,chronically occluded RCA   CARDIAC CATHETERIZATION  10/29/2004   No evidence of restenosis LAD but 30-40% narrowing prox. to stent,widely patient CX, old subtotalled RCA   CORONARY ANGIOPLASTY WITH STENT PLACEMENT  1995   CX & LAD   CORONARY ANGIOPLASTY WITH STENT PLACEMENT  12/25/2001   LCX   CORONARY ANGIOPLASTY WITH STENT PLACEMENT  11/07/2003   CX, planned stenting of LAD later   CORONARY ANGIOPLASTY WITH STENT PLACEMENT  12/05/2003   LAD   PERCUTANEOUS CORONARY ROTOBLATOR INTERVENTION (PCI-R)  1991   LAD   PTCA  1990   CX    Allergies  Allergies  Allergen Reactions   Ace Inhibitors Cough    Cough with Benazepril    Zetia [Ezetimibe] Other (See Comments)    myalgias     Labs/Other Studies Reviewed    The following studies were reviewed  today:  Cardiac Studies & Procedures     STRESS TESTS  NM MYOCAR MULTI W/SPECT W 01/20/2013   ECHOCARDIOGRAM  ECHOCARDIOGRAM COMPLETE 06/04/2016  Narrative *[facility]* [address] [place], [place] [address] [phone]  ------------------------------------------------------------------- Transthoracic Echocardiography  Patient:    Neel, Dearborn MR #:       [id number] Study Date: 06/04/2016 Gender:     M Age:        70 Height:     177.8 cm Weight:     101.9 kg BSA:        2.27 m^2 Pt. Status: Room:  REFERRING    Nicki Guadalajara, M.D. SONOGRAPHER  Junious Dresser, RDCS PERFORMING   [facility], Outpatient ATTENDING    Azalee Course [id number] Mable Fill [id number]  cc:  ------------------------------------------------------------------- LV EF: 45% -   50%  ------------------------------------------------------------------- Indications:      (R6.00).  (R06.02).  ------------------------------------------------------------------- History:   PMH:  Acquired from the patient and from the patient&'s chart.  Dyspnea and bilateral lower extremity edema.  Coronary artery disease.  Risk factors:  Hypertension. Diabetes mellitus. Obese. Dyslipidemia.  ------------------------------------------------------------------- Study Conclusions  - Left ventricle: The cavity size was normal. Wall thickness was normal. Systolic function was mildly reduced. The estimated ejection fraction was in the range of 45% to 50%. Doppler parameters are consistent with abnormal left ventricular relaxation (grade 1 diastolic dysfunction). -  Mitral valve: Mildly calcified annulus. - Left atrium: The atrium was mildly dilated.  ------------------------------------------------------------------- Labs, prior tests, procedures, and surgery: Echocardiography (2009).     EF was 45%.  ------------------------------------------------------------------- Study data:  The previous study was not  available, so comparison was made to the report of 2009.  Study status:  Routine. Procedure:  The patient reported no pain pre or post test. Transthoracic echocardiography for left ventricular function evaluation, for right ventricular function evaluation, for assessment of valvular function, and for evaluation of pulmonary pressures. Image quality was adequate.  Study completion:  There were no complications.          Transthoracic echocardiography. M-mode, complete 2D, spectral Doppler, and color Doppler. Birthdate:  Patient birthdate: 1946/02/28.  Age:  Patient is 77 yr old.  Sex:  Gender: male.    BMI: 32.2 kg/m^2.  Blood pressure: 122/70  Patient status:  Outpatient.  Study date:  Study date: 06/04/2016. Study time: 09:17 AM.  Location:  [facility]  -------------------------------------------------------------------  ------------------------------------------------------------------- Left ventricle:  The cavity size was normal. Wall thickness was normal. Systolic function was mildly reduced. The estimated ejection fraction was in the range of 45% to 50%. Doppler parameters are consistent with abnormal left ventricular relaxation (grade 1 diastolic dysfunction).  ------------------------------------------------------------------- Aortic valve:   Structurally normal valve.   Cusp separation was normal.  Doppler:  Transvalvular velocity was within the normal range. There was no stenosis. There was no regurgitation.  ------------------------------------------------------------------- Aorta:  Aortic root: The aortic root was normal in size. Ascending aorta: The ascending aorta was normal in size.  ------------------------------------------------------------------- Mitral valve:   Mildly calcified annulus. Leaflet separation was normal.  Doppler:  Transvalvular velocity was within the normal range. There was no evidence for stenosis. There was no regurgitation.    Peak  gradient (D): 2 mm Hg.  ------------------------------------------------------------------- Left atrium:  The atrium was mildly dilated.  ------------------------------------------------------------------- Right ventricle:  The cavity size was normal. Systolic function was normal.  ------------------------------------------------------------------- Pulmonic valve:    The valve appears to be grossly normal. Doppler:  There was no significant regurgitation.  ------------------------------------------------------------------- Tricuspid valve:   The valve appears to be grossly normal. Doppler:  There was no significant regurgitation.  ------------------------------------------------------------------- Right atrium:  The atrium was normal in size.  ------------------------------------------------------------------- Pericardium:  There was no pericardial effusion.  ------------------------------------------------------------------- Measurements  Left ventricle                         Value        Reference LV ID, ED, PLAX chordal                47.9  mm     43 - 52 LV ID, ES, PLAX chordal        (H)     42.4  mm     23 - 38 LV fx shortening, PLAX chordal (L)     11    %      >=29 LV PW thickness, ED                    5.78  mm     --------- IVS/LV PW ratio, ED            (H)     2.04         <=1.3 LV e&', lateral  7.13  cm/s   --------- LV E/e&', lateral                       11.01        --------- LV e&', medial                          5.81  cm/s   --------- LV E/e&', medial                        13.51        --------- LV e&', average                         6.47  cm/s   --------- LV E/e&', average                       12.13        ---------  Ventricular septum                     Value        Reference IVS thickness, ED                      11.8  mm     ---------  LVOT                                   Value        Reference LVOT peak velocity, S                   85.7  cm/s   --------- LVOT mean velocity, S                  68.4  cm/s   --------- LVOT VTI, S                            19.2  cm     --------- LVOT peak gradient, S                  3     mm Hg  ---------  Aorta                                  Value        Reference Aortic root ID, ED                     32    mm     --------- Ascending aorta ID, A-P, S             32    mm     ---------  Left atrium                            Value        Reference LA ID, A-P, ES                         44    mm     --------- LA ID/bsa, A-P  1.93  cm/m^2 <=2.2  Mitral valve                           Value        Reference Mitral E-wave peak velocity            78.5  cm/s   --------- Mitral A-wave peak velocity            101   cm/s   --------- Mitral deceleration time               218   ms     150 - 230 Mitral peak gradient, D                2     mm Hg  --------- Mitral E/A ratio, peak                 0.8          ---------  Right ventricle                        Value        Reference RV s&', lateral, S                      9.1   cm/s   ---------  Legend: (L)  and  (H)  mark values outside specified reference range.  ------------------------------------------------------------------- Prepared and Electronically Authenticated by  Kristeen Miss, M.D. 2017-10-10T10:57:57            Recent Labs: No results found for requested labs within last 365 days.  Recent Lipid Panel    Component Value Date/Time   CHOL 172 11/01/2019 0829   TRIG 100 11/01/2019 0829   HDL 44 11/01/2019 0829   CHOLHDL 3.9 11/01/2019 0829   CHOLHDL 2.8 10/08/2016 0931   VLDL 18 10/08/2016 0931   LDLCALC 110 (H) 11/01/2019 0829    History of Present Illness    77 year old male with the above past medical history including CAD s/p PTCA-LCx in 1990, PTCA-eccentric calcified LAD in 1991, s/p intervention-LAD, LCx, and D1 in 2005,ICM,  hypertension, hyperlipidemia, OSA, type 2  diabetes, and prostate cancer.  He suffered an inferior infarct in 1985 due to total RCA occlusion.  In 1990 he underwent PTCA of the circumflex and in 1991 he underwent directional coronary atherectomy of an eccentric calcified LAD stenosis.  He is also status post interventions to the circumflex coronary artery with his last intervention in 2005 at which time he also had an intervention to the LAD and first diagonal vessel.  He has a history of cardiomyopathy, prior EF 35 to 40%.  Echocardiogram in 2017 showed EF 45 to 50%, G1 DD, mild left atrial enlargement.  He was last seen in the office on 10/29/2019 and was stable from a cardiac standpoint.  He denies symptoms concerning for angina.  He has not been seen in follow-up since.  He presents today for follow-up and for preoperative cardiac evaluation for upcoming sebaceous cyst removal with Dr. Lars Masson of [facility].  Since his last visit he has been stable from a cardiac standpoint.  He denies any symptoms concerning for angina.  Denies dyspnea, edema, PND, orthopnea, weight gain.  Overall, he reports feeling well.  Home Medications    Current Outpatient Medications  Medication Sig Dispense Refill   amLODipine (NORVASC) 5 MG tablet Take  1 tablet (5 mg total) by mouth daily. 90 tablet 3   aspirin 81 MG tablet Take 81 mg by mouth daily.     Biotin 5000 MCG CAPS Take 1 capsule by mouth daily.     carvedilol (COREG) 25 MG tablet Take 1 tablet (25 mg total) by mouth 2 (two) times daily. 180 tablet 3   celecoxib (CELEBREX) 100 MG capsule Take 100 mg by mouth 2 (two) times daily.     Cholecalciferol (VITAMIN D3) 5000 units TABS Take 5,000 Units by mouth daily.     clopidogrel (PLAVIX) 75 MG tablet Take by mouth.     Cyanocobalamin 1000 MCG TBCR Frequency:Daily   Dosage:1   MCG  Instructions:  Note:TAKE 1 TABLET DAILY AS DIRECTED.     gabapentin (NEURONTIN) 100 MG capsule      glipiZIDE (GLUCOTROL) 5 MG tablet Take 5  mg by mouth daily before breakfast.     glucose blood (CONTOUR TEST) test strip Frequency:   Dosage:0     Instructions:  Note:CHECK BLOOD SUGAR TWICE DAILY     glucose blood (ONETOUCH VERIO) test strip USE 1 STRIP TO CHECK GLUCOSE THREE TIMES DAILY     hydrochlorothiazide (MICROZIDE) 12.5 MG capsule Take 12.5 mg by mouth daily.     insulin aspart protamine - aspart (NOVOLOG 70/30 MIX) (70-30) 100 UNIT/ML FlexPen Inject 50 Units into the skin.     irbesartan (AVAPRO) 300 MG tablet Take by mouth.     isosorbide mononitrate (IMDUR) 30 MG 24 hr tablet Take 1 tablet (30 mg total) by mouth daily. 90 tablet 3   Multiple Vitamin (MULTIVITAMIN) tablet Take 1 tablet by mouth daily.     Omega-3 Fatty Acids (KP FISH OIL) 1200 MG CAPS Take two (2) capsules by mouth each morning and one (1) capsule by mouth each evening.     pravastatin (PRAVACHOL) 80 MG tablet Take 40 mg by mouth daily.     triamterene-hydrochlorothiazide (MAXZIDE-25) 37.5-25 MG tablet Take 1 tablet by mouth every other day. 90 tablet 3   rosuvastatin (CRESTOR) 20 MG tablet Take 1 tablet (20 mg total) by mouth daily. (Patient not taking: Reported on 01/23/2023) 90 tablet 3   No current facility-administered medications for this visit.     Review of Systems    He denies chest pain, palpitations, dyspnea, pnd, orthopnea, n, v, dizziness, syncope, edema, weight gain, or early satiety. All other systems reviewed and are otherwise negative except as noted above.   Physical Exam    VS:  BP 130/74   Pulse 83   Ht 5\' 10"  (1.778 m)   Wt 219 lb (99.3 kg)   SpO2 90%   BMI 31.42 kg/m   GEN: Well nourished, well developed, in no acute distress. HEENT: normal. Neck: Supple, no JVD, carotid bruits, or masses. Cardiac: RRR, no murmurs, rubs, or gallops. No clubbing, cyanosis, edema.  Radials/DP/PT 2+ and equal bilaterally.  Respiratory:  Respirations regular and unlabored, clear to auscultation bilaterally. GI: Soft, nontender, nondistended, BS  + x 4. MS: no deformity or atrophy. Skin: warm and dry, no rash. Neuro:  Strength and sensation are intact. Psych: Normal affect.  Accessory Clinical Findings    ECG personally reviewed by me today -NSR, 80 bpm- no acute changes.   Lab Results  Component Value Date   WBC 6.2 11/01/2019   HGB 14.9 11/01/2019   HCT 43.3 11/01/2019   MCV 88 11/01/2019   PLT 157 11/01/2019   Lab Results  Component Value  Date   CREATININE 0.92 11/01/2019   BUN 17 11/01/2019   NA 141 11/01/2019   K 3.9 11/01/2019   CL 104 11/01/2019   CO2 23 11/01/2019   Lab Results  Component Value Date   ALT 24 11/01/2019   AST 28 11/01/2019   ALKPHOS 84 11/01/2019   BILITOT 0.4 11/01/2019   Lab Results  Component Value Date   CHOL 172 11/01/2019   HDL 44 11/01/2019   LDLCALC 110 (H) 11/01/2019   TRIG 100 11/01/2019   CHOLHDL 3.9 11/01/2019    Lab Results  Component Value Date   HGBA1C 7.2 (H) 04/11/2015    Assessment & Plan    1. CAD: S/p PTCA-LCx in 1990, PTCA-eccentric calcified LAD in 1991, s/p intervention-LAD, LCx, and D1 in 2005. Stable with no anginal symptoms. No indication for ischemic evaluation.  Continue aspirin, Plavix, amlodipine, carvedilol, irbesartan, and triamterene-HCTZ, Imdur, and pravastatin.  2. ICM:  Most recent echo in 2017 showed EF 45 to 50%, G1 DD, mild left atrial enlargement. Euvolemic and well compensated on exam.  No indication for loop diuretic at this time.  Continue current medications as above.   3. Hypertension: BP well controlled. Continue current antihypertensive regimen.   4. Hyperlipidemia: LDL was 80 in 07/2022.  Continue pravastatin.  5. OSA: Adherent to CPAP.   6. Type 2 diabetes: A1c was 9.4 in 07/2022.  Monitored and managed per PCP.  7. Preoperative cardiac exam: According to the Revised Cardiac Risk Index (RCRI), his Perioperative Risk of Major Cardiac Event is (%): 6.6. His Functional Capacity in METs is: 4.95 according to the [facility] Activity  Status Index (DASI). Therefore, based on ACC/AHA guidelines, patient would be at acceptable risk for the planned procedure without further cardiovascular testing.  Per office protocol, he may hold Plavix for 5 days prior to procedure. Please resume Plavix as soon as possible postprocedure, at the discretion of the surgeon. Regarding ASA therapy, we recommend continuation of ASA throughout the perioperative period.  However, if the surgeon feels that cessation of ASA is required in the perioperative period, it may be stopped 5-7 days prior to surgery with a plan to resume it as soon as felt to be feasible from a surgical standpoint in the post-operative period. I will route this recommendation to the requesting party via Epic fax function.  8. Disposition: Follow-up as scheduled with Dr. Tresa Endo in 05/2023.       Joylene Grapes, NP 01/23/2023, 2:54 PM

## 2023-01-24 NOTE — Telephone Encounter (Signed)
Spoke with pt. Pt is aware that he needs to take Pravastatin 80 mg daily as directed. Rosuvastatin was taken off pts med list.

## 2023-02-28 NOTE — Telephone Encounter (Signed)
Obtain a download to assess settings and response.

## 2023-03-04 NOTE — Telephone Encounter (Signed)
Change ramp time to 10  minutes starting at 8 cm;  increase CPAP Auto to 13 - 20 cm with EPR of 3.

## 2023-03-05 ENCOUNTER — Other Ambulatory Visit: Payer: Self-pay

## 2023-03-05 DIAGNOSIS — G4733 Obstructive sleep apnea (adult) (pediatric): Secondary | ICD-10-CM

## 2023-03-05 NOTE — Progress Notes (Signed)
Sent change in CPAP prescription to [facility] today 03/05/23. Changes sent (per Dr. Tresa Endo): Change ramp time to 10 minutes starting at 8 cm; increase CPAP Auto to 13 - 20 cm with EPR of 3.

## 2023-03-10 ENCOUNTER — Telehealth: Payer: Self-pay | Admitting: Cardiovascular Disease

## 2023-03-10 NOTE — Telephone Encounter (Signed)
Patient would like a call back to discuss his CPAP machine. Stated he got a call saying he need to adjust the pressure. Would like a call back to discuss. Please do not sent mychart message he would like to talk on the phone.

## 2023-03-10 NOTE — Telephone Encounter (Signed)
Patient calling about a his breathing machine. Please advise

## 2023-03-11 NOTE — Telephone Encounter (Signed)
Brandon Herrera; try to obtain a download so that we can see if his CPAP machine needs adjustment.

## 2023-03-17 NOTE — Telephone Encounter (Signed)
Patient is following up on his cpap machine. Patient would like to know if Dr. Tresa Endo agrees with adjusting his cpap machine. Patient is requesting we call him back, patient requested we do not contact him through MyChart. Please advise.

## 2023-03-17 NOTE — Telephone Encounter (Signed)
Spoke to the patient, he is calling for an update pertaining to his CAP machine.  Pt will like to know if he will need an new machine or will there be an adjustments made to his current machine. Will forward to MD and MA for advise

## 2023-03-19 NOTE — Telephone Encounter (Signed)
Patient is calling to get update on this is issue. Please advise.

## 2023-03-25 ENCOUNTER — Encounter: Payer: Self-pay | Admitting: Cardiovascular Disease

## 2023-03-25 ENCOUNTER — Telehealth: Payer: Self-pay | Admitting: Cardiovascular Disease

## 2023-03-25 ENCOUNTER — Telehealth: Payer: Self-pay

## 2023-03-25 DIAGNOSIS — G4733 Obstructive sleep apnea (adult) (pediatric): Secondary | ICD-10-CM

## 2023-03-25 MED ORDER — NITROGLYCERIN 0.4 MG SL SUBL: 0.4000 mg | SUBLINGUAL_TABLET | SUBLINGUAL | 11 refills | Status: AC | PRN

## 2023-03-25 NOTE — Telephone Encounter (Signed)
Error

## 2023-03-25 NOTE — Telephone Encounter (Signed)
The patient is calling back to follow up. He mentioned that he has had three heart attacks in the past and needs to have his nitroglycerin at all times. He would like to get his nitroglycerin as soon as possible.

## 2023-03-25 NOTE — Telephone Encounter (Signed)
Called patient left message on personal voice mail DOD Dr.Jordan advised ok to refill NTG.Prescription sent to your pharmacy.

## 2023-03-25 NOTE — Telephone Encounter (Signed)
Pt c/o medication issue:  1. Name of Medication:   Nitroglycerin  2. How are you currently taking this medication (dosage and times per day)?   As needed  3. Are you having a reaction (difficulty breathing--STAT)?   4. What is your medication issue?   Patient stated he lost his nitroglycerin medication and wants to get a new prescription for this medication sent to [facility] [address], [place] - [address].

## 2023-03-25 NOTE — Telephone Encounter (Signed)
I know the patient well but have not seen him in several years.  His CPAP machine most likely is no longer downloadable.  He would qualify for a new machine.  If adjustments need to be made see if his download can be obtained for adjustments but he may need to be seen for follow-up office visit and new machine order since this is been several years since I have seen him.

## 2023-03-25 NOTE — Telephone Encounter (Signed)
Received a call from patient advised I sent in NTG refill to his pharmacy. Stated [facility] told him he needed a new cpap machine.Advised I will send message to Dr.Kelly's sleep coordinator.

## 2023-03-25 NOTE — Telephone Encounter (Signed)
Patient called to follow-up on getting his CPAP machine pressure adjusted.

## 2023-03-25 NOTE — Telephone Encounter (Signed)
Left detailed message waiting on provider.  Message to DOD to [facility] refill nitroglycerin, Waiting for response

## 2023-03-25 NOTE — Telephone Encounter (Signed)
Returned pt call to let him know that his message has been sent to the provider.   Patient stated he lost his nitroglycerin medication and wants to get a new prescription for this medication sent to [facility] [address], [place] - [address].   He is also wondering about his CPAP machine. The company said he needs new pressure settings and pt is wanting to know what to do about that.

## 2023-03-26 NOTE — Telephone Encounter (Signed)
Notified patient of recommendations for new CPAP machine. New order placed and sent to [facility] 03/26/23.

## 2023-04-09 ENCOUNTER — Telehealth: Payer: Self-pay | Admitting: Cardiovascular Disease

## 2023-04-09 MED ORDER — CLOPIDOGREL BISULFATE 75 MG PO TABS: 75.0000 mg | ORAL_TABLET | Freq: Every day | ORAL | 2 refills | Status: DC

## 2023-04-09 MED ORDER — AMLODIPINE BESYLATE 5 MG PO TABS: 5.0000 mg | ORAL_TABLET | Freq: Every day | ORAL | 2 refills | Status: DC

## 2023-04-09 MED ORDER — CARVEDILOL 25 MG PO TABS: 25.0000 mg | ORAL_TABLET | Freq: Two times a day (BID) | ORAL | 2 refills | Status: DC

## 2023-04-09 NOTE — Telephone Encounter (Signed)
Pt's medications were sent to pt's pharmacy as requested. Confirmation received.  

## 2023-04-09 NOTE — Telephone Encounter (Signed)
Patient wants to get orders for new sleep equipment.

## 2023-04-09 NOTE — Telephone Encounter (Signed)
*  STAT* If patient is at the pharmacy, call can be transferred to refill team.   1. Which medications need to be refilled? (please list name of each medication and dose if known)   clopidogrel (PLAVIX) 75 MG tablet  carvedilol (COREG) 25 MG tablet (Expired)  amLODipine (NORVASC) 5 MG tablet  isosorbide mononitrate (IMDUR) 30 MG 24 hr tablet   2. Would you like to learn more about the convenience, safety, & potential cost savings by using the [facility]?   3. Are you open to using the [facility] (Type [facility]. ).  4. Which pharmacy/location (including street and city if local pharmacy) is medication to be sent to?  [facility] [address], [place] - [address]   5. Do they need a 30 day or 90 day supply?  90 day  Patient stated he only has a couple of pills left.  Patient has appointment on 10/10.

## 2023-04-17 ENCOUNTER — Telehealth: Payer: Self-pay | Admitting: Cardiovascular Disease

## 2023-04-17 NOTE — Telephone Encounter (Signed)
Returned call to pt in regards to his Coreg and the dosage. He states his family doctor has him on 12.5mg  twice a day and the "new" prescription from Dr. Tresa Endo is 25mg  twice a day. He is confused and wants clarification on what he should really be taking. Advised pt message will be sent to Dr. Tresa Endo and his nurse for clarification and recommendation. Pt verbalized understanding.

## 2023-04-17 NOTE — Telephone Encounter (Signed)
I have not seen him since a telemedicine visit in 2021.  Okay to start using the 12.5 twice a day.  However depending upon blood pressure and heart rate dose may need to be further increased if necessary.

## 2023-04-17 NOTE — Telephone Encounter (Signed)
Pt c/o medication issue:  1. Name of Medication:  carvedilol (COREG) 25 MG tablet  2. How are you currently taking this medication (dosage and times per day)?   3. Are you having a reaction (difficulty breathing--STAT)?   4. What is your medication issue?   Patient states his PCP prescribed 12.5 MG but his prescription from Dr. Tresa Endo is for 25 MG twice daily and he would like to clarify the does he should be taking.

## 2023-04-18 NOTE — Telephone Encounter (Signed)
Returned call to pt. Advised pt of Dr. Landry Dyke recommendation which is to take 12.5 mg twice a day. Advised pt he can split the 25 mg tablet in half and take one in the morning and one in the evening. Pt verbalized understanding and confirmed appt with Dr. Tresa Endo in October.

## 2023-04-29 ENCOUNTER — Telehealth: Payer: Self-pay | Admitting: Cardiovascular Disease

## 2023-04-29 NOTE — Telephone Encounter (Signed)
*  STAT* If patient is at the pharmacy, call can be transferred to refill team.   1. Which medications need to be refilled? (please list name of each medication and dose if known)  new prescription for Irbesartan   2. Would you like to learn more about the convenience, safety, & potential cost savings by using the [facility]?    3. Are you open to using the [facility] ([facility].    4. Which pharmacy/location (including street and city if local pharmacy) is medication to be sent to?  [facility] [address], [place]   5. Do they need a 30 day or 90 day supply?  90 days and refills

## 2023-04-30 MED ORDER — IRBESARTAN 300 MG PO TABS: 300.0000 mg | ORAL_TABLET | Freq: Every day | ORAL | 2 refills | Status: DC

## 2023-04-30 NOTE — Telephone Encounter (Signed)
Refill for Irbesartan has been sent to [facility], per pt's request.

## 2023-06-05 ENCOUNTER — Ambulatory Visit: Payer: PPO | Attending: Cardiovascular Disease | Admitting: Cardiovascular Disease

## 2023-06-06 ENCOUNTER — Encounter: Payer: Self-pay | Admitting: Cardiovascular Disease

## 2023-08-18 ENCOUNTER — Telehealth: Payer: Self-pay | Admitting: Cardiovascular Disease

## 2023-08-18 MED ORDER — ISOSORBIDE MONONITRATE ER 30 MG PO TB24: ORAL_TABLET | ORAL | 0 refills | Status: DC

## 2023-08-18 NOTE — Telephone Encounter (Signed)
*  STAT* If patient is at the pharmacy, call can be transferred to refill team.   1. Which medications need to be refilled? (please list name of each medication and dose if known)   isosorbide mononitrate (IMDUR) 30 MG 24 hr tablet     2. Would you like to learn more about the convenience, safety, & potential cost savings by using the [facility]? No   3. Are you open to using the [facility] ([facility]. ) No   4. Which pharmacy/location (including street and city if local pharmacy) is medication to be sent to?[facility] [address], [place] - [address]    5. Do they need a 30 day or 90 day supply? 90 day

## 2023-08-18 NOTE — Telephone Encounter (Signed)
Refills sent to preferred pharmacy.  

## 2023-11-07 ENCOUNTER — Other Ambulatory Visit: Payer: Self-pay | Admitting: Cardiovascular Disease

## 2023-11-07 MED ORDER — CARVEDILOL 25 MG PO TABS: 12.5000 mg | ORAL_TABLET | Freq: Two times a day (BID) | ORAL | 0 refills | Status: DC

## 2023-12-26 ENCOUNTER — Telehealth: Payer: Self-pay | Admitting: Cardiovascular Disease

## 2023-12-26 NOTE — Telephone Encounter (Signed)
 Will forward this message to our HIM pool to fax requested records to the contact information provided in this message.

## 2023-12-26 NOTE — Telephone Encounter (Signed)
  Mylinda Asa from [facility] calling to request copy of medication list fax to them at [phone]

## 2023-12-30 NOTE — Telephone Encounter (Signed)
 Message Received: Today Jearld Min, Maricela Shoe, NT  Peggi Bowels, LPN Caller: Unspecified (4 days ago, 10:29 AM) Will reach out to [place] with that number

## 2023-12-31 NOTE — Telephone Encounter (Signed)
 Called and confirmed fax number [phone]  Will forward to HIM Pool to fax

## 2024-01-07 ENCOUNTER — Other Ambulatory Visit: Payer: Self-pay | Admitting: Cardiovascular Disease

## 2024-01-18 ENCOUNTER — Other Ambulatory Visit: Payer: Self-pay | Admitting: Cardiovascular Disease

## 2024-01-27 ENCOUNTER — Ambulatory Visit: Attending: Cardiovascular Disease | Admitting: Cardiovascular Disease

## 2024-01-27 ENCOUNTER — Encounter: Payer: Self-pay | Admitting: Cardiovascular Disease

## 2024-01-27 VITALS — BP 110/50 | HR 87 | Ht 70.0 in | Wt 204.6 lb

## 2024-01-27 DIAGNOSIS — Z794 Long term (current) use of insulin: Secondary | ICD-10-CM

## 2024-01-27 DIAGNOSIS — I255 Ischemic cardiomyopathy: Secondary | ICD-10-CM | POA: Diagnosis not present

## 2024-01-27 DIAGNOSIS — G4733 Obstructive sleep apnea (adult) (pediatric): Secondary | ICD-10-CM

## 2024-01-27 DIAGNOSIS — E785 Hyperlipidemia, unspecified: Secondary | ICD-10-CM | POA: Diagnosis not present

## 2024-01-27 DIAGNOSIS — I2510 Atherosclerotic heart disease of native coronary artery without angina pectoris: Secondary | ICD-10-CM

## 2024-01-27 DIAGNOSIS — E118 Type 2 diabetes mellitus with unspecified complications: Secondary | ICD-10-CM

## 2024-01-27 DIAGNOSIS — Z9861 Coronary angioplasty status: Secondary | ICD-10-CM

## 2024-01-27 DIAGNOSIS — I10 Essential (primary) hypertension: Secondary | ICD-10-CM

## 2024-01-27 NOTE — Patient Instructions (Signed)
 Medication Instructions:  No changes *If you need a refill on your cardiac medications before your next appointment, please call your pharmacy*  Lab Work: No labs If you have labs (blood work) drawn today and your tests are completely normal, you will receive your results only by: MyChart Message (if you have MyChart) OR A paper copy in the mail If you have any lab test that is abnormal or we need to change your treatment, we will call you to review the results.  Testing/Procedures: No testing  Follow-Up: At [facility], you and your health needs are our priority.  As part of our continuing mission to provide you with exceptional heart care, our providers are all part of one team.  This team includes your primary Cardiologist (physician) and Advanced Practice Providers or APPs (Physician Assistants and Nurse Practitioners) who all work together to provide you with the care you need, when you need it.  Your next appointment:   6 month(s)  Provider:   Marlana Silvan, NP, Then, Randene Bustard MD will plan to see you again in 1 year(s).    We recommend signing up for the patient portal called "MyChart".  Sign up information is provided on this After Visit Summary.  MyChart is used to connect with patients for Virtual Visits (Telemedicine).  Patients are able to view lab/test results, encounter notes, upcoming appointments, etc.  Non-urgent messages can be sent to your provider as well.   To learn more about what you can do with MyChart, go to [email or web address].
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
## 2024-01-29 ENCOUNTER — Encounter: Payer: Self-pay | Admitting: Cardiovascular Disease

## 2024-02-04 ENCOUNTER — Telehealth: Payer: Self-pay | Admitting: Cardiovascular Disease

## 2024-02-04 NOTE — Telephone Encounter (Signed)
 Patient's needs a new prescription for a cpap machine. Please advise

## 2024-02-11 ENCOUNTER — Other Ambulatory Visit: Payer: Self-pay | Admitting: Cardiovascular Disease

## 2024-03-08 ENCOUNTER — Other Ambulatory Visit: Payer: Self-pay | Admitting: Cardiovascular Disease

## 2024-03-16 ENCOUNTER — Telehealth: Payer: Self-pay | Admitting: Cardiovascular Disease

## 2024-03-16 MED ORDER — ISOSORBIDE MONONITRATE ER 30 MG PO TB24: ORAL_TABLET | ORAL | 1 refills | Status: AC

## 2024-03-16 NOTE — Telephone Encounter (Signed)
 Pt's medication was sent to pt's pharmacy as requested. Confirmation received.

## 2024-03-16 NOTE — Telephone Encounter (Signed)
*  STAT* If patient is at the pharmacy, call can be transferred to refill team.   1. Which medications need to be refilled? (please list name of each medication and dose if known) Isosorbide    2. Would you like to learn more about the convenience, safety, & potential cost savings by using the [facility]?     3. Are you open to using the [facility] (Type [facility]. .   4. Which pharmacy/location (including street and city if local pharmacy) is medication to be sent to?[facility]  [address] [place], [place]   5. Do they need a 30 day or 90 day supply? 90 days and refills- please call it in today- out of medicine

## 2024-03-17 ENCOUNTER — Other Ambulatory Visit: Payer: Self-pay

## 2024-03-17 MED ORDER — CARVEDILOL 25 MG PO TABS: 12.5000 mg | ORAL_TABLET | Freq: Two times a day (BID) | ORAL | 3 refills | Status: AC

## 2024-04-17 ENCOUNTER — Other Ambulatory Visit: Payer: Self-pay | Admitting: Nurse Practitioner

## 2024-04-19 ENCOUNTER — Telehealth: Payer: Self-pay | Admitting: Home Health

## 2024-04-19 ENCOUNTER — Other Ambulatory Visit: Payer: Self-pay

## 2024-04-19 MED ORDER — CLOPIDOGREL BISULFATE 75 MG PO TABS
75.0000 mg | ORAL_TABLET | Freq: Every day | ORAL | 3 refills | Status: AC
Start: 2024-04-19 — End: ?

## 2024-04-19 NOTE — Telephone Encounter (Signed)
 Patient called after hours, request refill for Plavix  75mg  daily. Appears this was refilled today by Damien Braver NP-C with 3 refills. Will not re-send the script. Advised the patient call back if further issues.

## 2024-06-08 ENCOUNTER — Other Ambulatory Visit: Payer: Self-pay

## 2024-06-10 MED ORDER — AMLODIPINE BESYLATE 5 MG PO TABS: 5.0000 mg | ORAL_TABLET | Freq: Every day | ORAL | 2 refills | Status: AC

## 2024-08-01 ENCOUNTER — Other Ambulatory Visit: Payer: Self-pay | Admitting: Nurse Practitioner

## 2024-08-02 ENCOUNTER — Ambulatory Visit: Attending: Nurse Practitioner | Admitting: Nurse Practitioner

## 2024-08-02 NOTE — Progress Notes (Deleted)
 Office Visit    Patient Name: Brandon Herrera Date of Encounter: 08/02/2024  Primary Care Provider:  Dyane Faden, MD Primary Cardiologist:  Debby Sor, MD (Inactive)  Chief Complaint    78 year old male with a history of CAD s/p PTCA-LCx in 1990, PTCA-eccentric calcified LAD in 1991, s/p intervention-LAD, LCx, and D1 in 2005, ICM,  hypertension, hyperlipidemia, OSA, type 2 diabetes, and prostate cancer who presents for follow-up related to CAD and for preoperative cardiac evaluation.   Past Medical History    Past Medical History:  Diagnosis Date   Abnormal nuclear stress test 03/03/2012   mod size inferior scar w/new anterolateral wall ischemia towards apex   CAD (coronary artery disease)    Diabetes mellitus (HCC)    Heart murmur    07/28/2008 ECHO: mild mitral annular ca+,AOV mildly sclerotic,mild LVH,mod.global hypokinesis,mild to mod post wall hypokinesis, EF 35-40%,LA mildly dilated   Hyperlipidemia    Hypertension    Inferior MI (HCC) 1985   totalled RCA   OSA on CPAP    Prostate cancer (HCC)    Seed implant   Past Surgical History:  Procedure Laterality Date   CARDIAC CATHETERIZATION  02/11/2003   patent CX stent,chronically occluded RCA   CARDIAC CATHETERIZATION  10/29/2004   No evidence of restenosis LAD but 30-40% narrowing prox. to stent,widely patient CX, old subtotalled RCA   CORONARY ANGIOPLASTY WITH STENT PLACEMENT  1995   CX & LAD   CORONARY ANGIOPLASTY WITH STENT PLACEMENT  12/25/2001   LCX   CORONARY ANGIOPLASTY WITH STENT PLACEMENT  11/07/2003   CX, planned stenting of LAD later   CORONARY ANGIOPLASTY WITH STENT PLACEMENT  12/05/2003   LAD   PERCUTANEOUS CORONARY ROTOBLATOR INTERVENTION (PCI-R)  1991   LAD   PTCA  1990   CX    Allergies  Allergies  Allergen Reactions   Ace Inhibitors Cough    Cough with Benazepril     Zetia [Ezetimibe] Other (See Comments)    myalgias     Labs/Other Studies Reviewed    The following studies were reviewed  today: *** Cardiac Studies & Procedures   ______________________________________________________________________________________________   STRESS TESTS  NM MYOCAR MULTI W/SPECT W 03/03/2012   ECHOCARDIOGRAM  ECHOCARDIOGRAM COMPLETE 06/04/2016  Narrative *[facility]* [address] [place], [place] [address] [phone]  ------------------------------------------------------------------- Transthoracic Echocardiography  Patient:    Brandon, Herrera MR #:       [id number] Study Date: 06/04/2016 Gender:     M Age:        70 Height:     177.8 cm Weight:     101.9 kg BSA:        2.27 m^2 Pt. Status: Room:  REFERRING    Debby Sor, M.D. SONOGRAPHER  Shanda Boers, RDCS PERFORMING   [facility], Outpatient ATTENDING    Meng, Hao [id number] ORDERING     Meng, Hao [id number]  cc:  ------------------------------------------------------------------- LV EF: 45% -   50%  ------------------------------------------------------------------- Indications:      (R6.00).  (R06.02).  ------------------------------------------------------------------- History:   PMH:  Acquired from the patient and from the patient&'s chart.  Dyspnea and bilateral lower extremity edema.  Coronary artery disease.  Risk factors:  Hypertension. Diabetes mellitus. Obese. Dyslipidemia.  ------------------------------------------------------------------- Study Conclusions  - Left ventricle: The cavity size was normal. Wall thickness was normal. Systolic function was mildly reduced. The estimated ejection fraction was in the range of 45% to 50%. Doppler parameters are consistent with abnormal left ventricular relaxation (grade 1  diastolic dysfunction). - Mitral valve: Mildly calcified annulus. - Left atrium: The atrium was mildly dilated.  ------------------------------------------------------------------- Labs, prior tests, procedures, and surgery: Echocardiography (2009).     EF was  45%.  ------------------------------------------------------------------- Study data:  The previous study was not available, so comparison was made to the report of 2009.  Study status:  Routine. Procedure:  The patient reported no pain pre or post test. Transthoracic echocardiography for left ventricular function evaluation, for right ventricular function evaluation, for assessment of valvular function, and for evaluation of pulmonary pressures. Image quality was adequate.  Study completion:  There were no complications.          Transthoracic echocardiography. M-mode, complete 2D, spectral Doppler, and color Doppler. Birthdate:  Patient birthdate: 07/11/1946.  Age:  Patient is 78 yr old.  Sex:  Gender: male.    BMI: 32.2 kg/m^2.  Blood pressure: 122/70  Patient status:  Outpatient.  Study date:  Study date: 06/04/2016. Study time: 09:17 AM.  Location:  [facility]  -------------------------------------------------------------------  ------------------------------------------------------------------- Left ventricle:  The cavity size was normal. Wall thickness was normal. Systolic function was mildly reduced. The estimated ejection fraction was in the range of 45% to 50%. Doppler parameters are consistent with abnormal left ventricular relaxation (grade 1 diastolic dysfunction).  ------------------------------------------------------------------- Aortic valve:   Structurally normal valve.   Cusp separation was normal.  Doppler:  Transvalvular velocity was within the normal range. There was no stenosis. There was no regurgitation.  ------------------------------------------------------------------- Aorta:  Aortic root: The aortic root was normal in size. Ascending aorta: The ascending aorta was normal in size.  ------------------------------------------------------------------- Mitral valve:   Mildly calcified annulus. Leaflet separation was normal.  Doppler:  Transvalvular  velocity was within the normal range. There was no evidence for stenosis. There was no regurgitation.    Peak gradient (D): 2 mm Hg.  ------------------------------------------------------------------- Left atrium:  The atrium was mildly dilated.  ------------------------------------------------------------------- Right ventricle:  The cavity size was normal. Systolic function was normal.  ------------------------------------------------------------------- Pulmonic valve:    The valve appears to be grossly normal. Doppler:  There was no significant regurgitation.  ------------------------------------------------------------------- Tricuspid valve:   The valve appears to be grossly normal. Doppler:  There was no significant regurgitation.  ------------------------------------------------------------------- Right atrium:  The atrium was normal in size.  ------------------------------------------------------------------- Pericardium:  There was no pericardial effusion.  ------------------------------------------------------------------- Measurements  Left ventricle                         Value        Reference LV ID, ED, PLAX chordal                47.9  mm     43 - 52 LV ID, ES, PLAX chordal        (H)     42.4  mm     23 - 38 LV fx shortening, PLAX chordal (L)     11    %      >=29 LV PW thickness, ED                    5.78  mm     --------- IVS/LV PW ratio, ED            (H)     2.04         <=1.3 LV e&', lateral  7.13  cm/s   --------- LV E/e&', lateral                       11.01        --------- LV e&', medial                          5.81  cm/s   --------- LV E/e&', medial                        13.51        --------- LV e&', average                         6.47  cm/s   --------- LV E/e&', average                       12.13        ---------  Ventricular septum                     Value        Reference IVS thickness, ED                      11.8   mm     ---------  LVOT                                   Value        Reference LVOT peak velocity, S                  85.7  cm/s   --------- LVOT mean velocity, S                  68.4  cm/s   --------- LVOT VTI, S                            19.2  cm     --------- LVOT peak gradient, S                  3     mm Hg  ---------  Aorta                                  Value        Reference Aortic root ID, ED                     32    mm     --------- Ascending aorta ID, A-P, S             32    mm     ---------  Left atrium                            Value        Reference LA ID, A-P, ES                         44    mm     --------- LA ID/bsa, A-P  1.93  cm/m^2 <=2.2  Mitral valve                           Value        Reference Mitral E-wave peak velocity            78.5  cm/s   --------- Mitral A-wave peak velocity            101   cm/s   --------- Mitral deceleration time               218   ms     150 - 230 Mitral peak gradient, D                2     mm Hg  --------- Mitral E/A ratio, peak                 0.8          ---------  Right ventricle                        Value        Reference RV s&', lateral, S                      9.1   cm/s   ---------  Legend: (L)  and  (H)  mark values outside specified reference range.  ------------------------------------------------------------------- Prepared and Electronically Authenticated by  Aleene Passe, M.D. 2017-10-10T10:57:57          ______________________________________________________________________________________________     Recent Labs: No results found for requested labs within last 365 days.  Recent Lipid Panel    Component Value Date/Time   CHOL 172 11/01/2019 0829   TRIG 100 11/01/2019 0829   HDL 44 11/01/2019 0829   CHOLHDL 3.9 11/01/2019 0829   CHOLHDL 2.8 10/08/2016 0931   VLDL 18 10/08/2016 0931   LDLCALC 110 (H) 11/01/2019 0829    History of Present Illness     78 year old male with the above past medical history including CAD s/p PTCA-LCx in 1990, PTCA-eccentric calcified LAD in 1991, s/p intervention-LAD, LCx, and D1 in 2005,ICM,  hypertension, hyperlipidemia, OSA, type 2 diabetes, and prostate cancer.   He suffered an inferior infarct in 1985 due to total RCA occlusion.  In 1990 he underwent PTCA of the circumflex and in 1991 he underwent directional coronary atherectomy of an eccentric calcified LAD stenosis.  He is also status post interventions to the circumflex coronary artery with his last intervention in 2005 at which time he also had an intervention to the LAD and first diagonal vessel.  He has a history of cardiomyopathy, prior EF 35 to 40%. Echocardiogram in 2017 showed EF 45 to 50%, G1 DD, mild left atrial enlargement.  He was last seen in the office on 01/27/2024 and was stable from a cardiac standpoint. He denied symptoms concerning for angina.  He was hospitalized from 07/24/2019 07/29/2024 at [facility] in the setting of urosepsis, Klebsiella UTI, nephrolithiasis s/p stent placement, AKI.  He was treated with IV antibiotics.   He presents today for follow-up. Since his last visit   Harding/Turner OSA  1. CAD: S/p PTCA-LCx in 1990, PTCA-eccentric calcified LAD in 1991, s/p intervention-LAD, LCx, and D1 in 2005. Stable with no anginal symptoms. No indication for ischemic evaluation.  Continue aspirin , Plavix , amlodipine , carvedilol , irbesartan , and triamterene -HCTZ, Imdur , and pravastatin .   2. ICM:  Most recent echo in 2017 showed EF 45 to 50%, G1 DD, mild left atrial enlargement. Euvolemic and well compensated on exam.  No indication for loop diuretic at this time.  Continue current medications as above.     3. Hypertension: BP well controlled. Continue current antihypertensive regimen.    4. Hyperlipidemia: LDL was 80 in 07/2022.  Continue pravastatin .   5. OSA: Adherent to CPAP.    6. Type 2 diabetes: A1c was 9.4 in 07/2022.   Monitored and managed per PCP.   7.   8. Disposition: Follow-up   Home Medications    Current Outpatient Medications  Medication Sig Dispense Refill   amLODipine  (NORVASC ) 5 MG tablet Take 1 tablet (5 mg total) by mouth daily. 90 tablet 2   Biotin 5000 MCG CAPS Take 1 capsule by mouth daily. (Patient not taking: Reported on 01/27/2024)     Blood Glucose Monitoring Suppl (GLUCOCOM BLOOD GLUCOSE MONITOR) DEVI every morning.     carvedilol  (COREG ) 25 MG tablet Take 0.5 tablets (12.5 mg total) by mouth in the morning and at bedtime. 90 tablet 3   celecoxib  (CELEBREX ) 100 MG capsule Take 100 mg by mouth 2 (two) times daily.     Cholecalciferol  (VITAMIN D3) 5000 units TABS Take 5,000 Units by mouth daily. (Patient not taking: Reported on 01/27/2024)     clopidogrel  (PLAVIX ) 75 MG tablet Take 1 tablet (75 mg total) by mouth daily. 30 tablet 3   Cyanocobalamin 1000 MCG TBCR Frequency:Daily   Dosage:1   MCG  Instructions:  Note:TAKE 1 TABLET DAILY AS DIRECTED. (Patient not taking: Reported on 01/27/2024)     doxycycline (VIBRA-TABS) 100 MG tablet Take 100 mg by mouth daily. (Patient not taking: Reported on 01/27/2024)     gabapentin  (NEURONTIN ) 100 MG capsule      gabapentin  (NEURONTIN ) 100 MG capsule Take 200 mg by mouth at bedtime.     glipiZIDE (GLUCOTROL) 5 MG tablet Take 5 mg by mouth daily before breakfast.     glucose blood (CONTOUR TEST) test strip Frequency:   Dosage:0     Instructions:  Note:CHECK BLOOD SUGAR TWICE DAILY (Patient not taking: Reported on 01/27/2024)     glucose blood (ONETOUCH VERIO) test strip USE 1 STRIP TO CHECK GLUCOSE THREE TIMES DAILY     hydrochlorothiazide (MICROZIDE) 12.5 MG capsule Take 12.5 mg by mouth daily. (Patient not taking: Reported on 01/27/2024)     insulin  aspart protamine - aspart (NOVOLOG 70/30 MIX) (70-30) 100 UNIT/ML FlexPen Inject 45 Units into the skin 2 (two) times daily with a meal.     irbesartan  (AVAPRO ) 300 MG tablet Take 1 tablet (300 mg total) by mouth  daily. 30 tablet 10   isosorbide  mononitrate (IMDUR ) 30 MG 24 hr tablet Take 1 tablet (30 mg total) by mouth daily. 90 tablet 1   Multiple Vitamin (MULTIVITAMIN) tablet Take 1 tablet by mouth daily.     nitroGLYCERIN  (NITROSTAT ) 0.4 MG SL tablet Place 1 tablet (0.4 mg total) under the tongue every 5 (five) minutes as needed for chest pain. (Patient not taking: Reported on 01/27/2024) 25 tablet 11   NOVOLOG MIX 70/30 (70-30) 100 UNIT/ML injection Inject 45 Units into the skin 2 (two) times daily with a meal.     Omega-3 Fatty Acids (FISH OIL) 1000 MG CAPS Take 1,000 mg by mouth daily at 6 (six) AM.     Omega-3 Fatty Acids (KP FISH OIL) 1200 MG CAPS Take two (2) capsules by mouth each morning and  one (1) capsule by mouth each evening. (Patient not taking: Reported on 01/27/2024)     rosuvastatin  (CRESTOR ) 40 MG tablet Take 40 mg by mouth daily.     Suvorexant (BELSOMRA PO) Take 1 tablet by mouth at bedtime.     tamsulosin (FLOMAX) 0.4 MG CAPS capsule Take 0.4 mg by mouth daily.     No current facility-administered medications for this visit.     Review of Systems    ***.  All other systems reviewed and are otherwise negative except as noted above.    Physical Exam    VS:  There were no vitals taken for this visit. , BMI There is no height or weight on file to calculate BMI.     GEN: Well nourished, well developed, in no acute distress. HEENT: normal. Neck: Supple, no JVD, carotid bruits, or masses. Cardiac: RRR, no murmurs, rubs, or gallops. No clubbing, cyanosis, edema.  Radials/DP/PT 2+ and equal bilaterally.  Respiratory:  Respirations regular and unlabored, clear to auscultation bilaterally. GI: Soft, nontender, nondistended, BS + x 4. MS: no deformity or atrophy. Skin: warm and dry, no rash. Neuro:  Strength and sensation are intact. Psych: Normal affect.  Accessory Clinical Findings    ECG personally reviewed by me today -    - no acute changes.   Lab Results  Component Value  Date   WBC 6.2 11/01/2019   HGB 14.9 11/01/2019   HCT 43.3 11/01/2019   MCV 88 11/01/2019   PLT 157 11/01/2019   Lab Results  Component Value Date   CREATININE 0.92 11/01/2019   BUN 17 11/01/2019   NA 141 11/01/2019   K 3.9 11/01/2019   CL 104 11/01/2019   CO2 23 11/01/2019   Lab Results  Component Value Date   ALT 24 11/01/2019   AST 28 11/01/2019   ALKPHOS 84 11/01/2019   BILITOT 0.4 11/01/2019   Lab Results  Component Value Date   CHOL 172 11/01/2019   HDL 44 11/01/2019   LDLCALC 110 (H) 11/01/2019   TRIG 100 11/01/2019   CHOLHDL 3.9 11/01/2019    Lab Results  Component Value Date   HGBA1C 7.2 (H) 04/11/2015    Assessment & Plan    1.  ***  No BP recorded.  {Refresh Note OR Click here to enter BP  :1}***   Damien JAYSON Braver, NP 08/02/2024, 5:53 AM
